# Patient Record
Sex: Female | Born: 1970 | Race: White | Hispanic: No | Marital: Married | Smoking: Never smoker
Health system: Southern US, Community
[De-identification: ages and names within clinical notes are randomized; demographics above are authoritative.]

## PROBLEM LIST (undated history)

## (undated) DIAGNOSIS — D649 Anemia, unspecified: Secondary | ICD-10-CM

## (undated) DIAGNOSIS — A630 Anogenital (venereal) warts: Secondary | ICD-10-CM

## (undated) DIAGNOSIS — G43909 Migraine, unspecified, not intractable, without status migrainosus: Secondary | ICD-10-CM

## (undated) DIAGNOSIS — A6000 Herpesviral infection of urogenital system, unspecified: Secondary | ICD-10-CM

## (undated) DIAGNOSIS — M3500 Sicca syndrome, unspecified: Secondary | ICD-10-CM

## (undated) DIAGNOSIS — K5792 Diverticulitis of intestine, part unspecified, without perforation or abscess without bleeding: Secondary | ICD-10-CM

## (undated) DIAGNOSIS — I7300 Raynaud's syndrome without gangrene: Secondary | ICD-10-CM

## (undated) DIAGNOSIS — K219 Gastro-esophageal reflux disease without esophagitis: Secondary | ICD-10-CM

## (undated) HISTORY — DX: Anemia, unspecified: D64.9

## (undated) HISTORY — DX: Anogenital (venereal) warts: A63.0

## (undated) HISTORY — DX: Herpesviral infection of urogenital system, unspecified: A60.00

## (undated) HISTORY — PX: BILATERAL SALPINGECTOMY: SHX5743

## (undated) HISTORY — DX: Gastro-esophageal reflux disease without esophagitis: K21.9

## (undated) HISTORY — DX: Migraine, unspecified, not intractable, without status migrainosus: G43.909

---

## 2000-01-11 ENCOUNTER — Emergency Department (HOSPITAL_COMMUNITY): Admission: EM | Admit: 2000-01-11 | Discharge: 2000-01-11 | Payer: Self-pay | Admitting: *Deleted

## 2004-12-13 ENCOUNTER — Encounter: Admission: RE | Admit: 2004-12-13 | Discharge: 2004-12-13 | Payer: Self-pay | Admitting: Family Medicine

## 2011-07-21 ENCOUNTER — Other Ambulatory Visit: Payer: Self-pay | Admitting: Family Medicine

## 2011-07-21 DIAGNOSIS — N6310 Unspecified lump in the right breast, unspecified quadrant: Secondary | ICD-10-CM

## 2011-08-01 ENCOUNTER — Ambulatory Visit
Admission: RE | Admit: 2011-08-01 | Discharge: 2011-08-01 | Disposition: A | Discharge status: Home / Self Care | Payer: BC Managed Care – PPO | Source: Ambulatory Visit | Attending: Family Medicine | Admitting: Family Medicine

## 2011-08-01 DIAGNOSIS — N6310 Unspecified lump in the right breast, unspecified quadrant: Secondary | ICD-10-CM

## 2013-01-06 ENCOUNTER — Encounter (INDEPENDENT_AMBULATORY_CARE_PROVIDER_SITE_OTHER): Payer: Self-pay

## 2013-01-06 ENCOUNTER — Encounter: Payer: Self-pay | Admitting: Family Medicine

## 2013-01-06 ENCOUNTER — Ambulatory Visit (INDEPENDENT_AMBULATORY_CARE_PROVIDER_SITE_OTHER): Payer: BC Managed Care – PPO | Admitting: Family Medicine

## 2013-01-06 VITALS — BP 127/81 | HR 94 | Resp 16 | Ht 67.0 in | Wt 186.0 lb

## 2013-01-06 DIAGNOSIS — N760 Acute vaginitis: Secondary | ICD-10-CM | POA: Insufficient documentation

## 2013-01-06 DIAGNOSIS — W5501XA Bitten by cat, initial encounter: Secondary | ICD-10-CM | POA: Insufficient documentation

## 2013-01-06 DIAGNOSIS — IMO0001 Reserved for inherently not codable concepts without codable children: Secondary | ICD-10-CM

## 2013-01-06 DIAGNOSIS — T148XXA Other injury of unspecified body region, initial encounter: Secondary | ICD-10-CM

## 2013-01-06 DIAGNOSIS — Z23 Encounter for immunization: Secondary | ICD-10-CM | POA: Insufficient documentation

## 2013-01-06 MED ORDER — METRONIDAZOLE 0.75 % VA GEL: 1.0000 | Freq: Two times a day (BID) | VAGINAL | Status: AC

## 2013-01-06 MED ORDER — CEPHALEXIN 500 MG PO CAPS: 500.0000 mg | ORAL_CAPSULE | Freq: Four times a day (QID) | ORAL | Status: AC

## 2013-01-06 MED ORDER — TERCONAZOLE 0.8 % VA CREA: 1.0000 | TOPICAL_CREAM | Freq: Every day | VAGINAL | Status: AC

## 2013-01-06 MED ORDER — MUPIROCIN CALCIUM 2 % EX CREA: TOPICAL_CREAM | CUTANEOUS | Status: AC

## 2013-01-06 MED ORDER — FLUCONAZOLE 200 MG PO TABS: ORAL_TABLET | ORAL | Status: AC

## 2013-01-06 NOTE — Assessment & Plan Note (Signed)
Treating with both Keflex and Bactroban to make sure that she does not develop a serious infection.

## 2013-01-06 NOTE — Assessment & Plan Note (Signed)
She received a Tdap without difficulty.

## 2013-01-06 NOTE — Assessment & Plan Note (Signed)
She has a long history of vaginal infections especially after taking an antibiotic. She was given medications for both BV and Yeast.

## 2013-01-06 NOTE — Patient Instructions (Signed)

## 2013-01-06 NOTE — Progress Notes (Signed)
  Subjective:    Patient ID: Amanda Pugh, female    DOB: 07/17/1970, 42 y.o.   MRN: [id number]   Amanda Pugh is here today to have her left index finger evaluated. She was bitten/scratched by a cat at work.      Animal Bite  The incident occurred more than 2 days ago. The incident occurred at work. There is an injury to the left index finger. The pain is moderate. It is unlikely that a foreign body is present. There have been no prior injuries to these areas. She is right-handed. Her tetanus status is out of date. She has been behaving normally. There were no sick contacts. She has received no recent medical care.      Review of Systems  Constitutional: Negative.   HENT: Negative.   Eyes: Negative.   Respiratory: Negative.   Gastrointestinal: Negative.   Endocrine: Negative.   Genitourinary: Negative.   Musculoskeletal:       Left index finger is puffy and red.  Skin: Positive for wound.  Allergic/Immunologic: Negative.   Neurological: Negative.   Hematological: Negative.   Psychiatric/Behavioral: Negative.  Negative for behavioral problems.     Past Medical History  Diagnosis Date  . Anemia   . GERD (gastroesophageal reflux disease)   . Migraines   . Genital warts   . Genital herpes       Family History  Problem Relation Age of Onset  . Hyperlipidemia Mother   . Hypertension Father   . Cancer Paternal Aunt     Breast Cancer  . Diabetes Maternal Grandmother       History   Social History Narrative   Marital Status: Married Museum/gallery curator)   Children:  None    Pets: Dog, Cat    Living Situation: Husband   Occupation: Designer, industrial/product   Alcohol Use:  Occasional   Drug Use:  None   Diet:  Regular   Exercise:  None   Hobbies: Shopping   [Tobacco: Never smoker]              Objective:   Physical Exam  Constitutional: She is oriented to person, place, and time. She appears well-developed and well-nourished.  HENT:   Head: Normocephalic.  Eyes: Pupils are equal, round, and reactive to light.  Neck: Normal range of motion.  Pulmonary/Chest: Effort normal.  Musculoskeletal: Normal range of motion.  Neurological: She is alert and oriented to person, place, and time.  Skin: Skin is warm and dry. There is erythema.  Puncture wound on left index finger   Psychiatric: She has a normal mood and affect. Her behavior is normal. Judgment and thought content normal.          Assessment & Plan:

## 2015-07-27 ENCOUNTER — Emergency Department (HOSPITAL_BASED_OUTPATIENT_CLINIC_OR_DEPARTMENT_OTHER)
Admission: EM | Admit: 2015-07-27 | Discharge: 2015-07-27 | Disposition: A | Discharge status: Home / Self Care | Payer: Commercial Managed Care - PPO | Attending: Emergency Medicine | Admitting: Emergency Medicine

## 2015-07-27 ENCOUNTER — Emergency Department (HOSPITAL_BASED_OUTPATIENT_CLINIC_OR_DEPARTMENT_OTHER): Payer: Commercial Managed Care - PPO

## 2015-07-27 ENCOUNTER — Encounter (HOSPITAL_BASED_OUTPATIENT_CLINIC_OR_DEPARTMENT_OTHER): Payer: Self-pay | Admitting: *Deleted

## 2015-07-27 DIAGNOSIS — R1011 Right upper quadrant pain: Secondary | ICD-10-CM | POA: Diagnosis not present

## 2015-07-27 DIAGNOSIS — Z79899 Other long term (current) drug therapy: Secondary | ICD-10-CM | POA: Diagnosis not present

## 2015-07-27 LAB — URINALYSIS, ROUTINE W REFLEX MICROSCOPIC
BILIRUBIN URINE: NEGATIVE
GLUCOSE, UA: NEGATIVE mg/dL
HGB URINE DIPSTICK: NEGATIVE
KETONES UR: NEGATIVE mg/dL
Nitrite: NEGATIVE
PROTEIN: NEGATIVE mg/dL
SPECIFIC GRAVITY, URINE: 1.024 (ref 1.005–1.030)
pH: 5.5 (ref 5.0–8.0)

## 2015-07-27 LAB — CBC WITH DIFFERENTIAL/PLATELET
Basophils Absolute: 0 10*3/uL (ref 0.0–0.1)
Basophils Relative: 0 %
EOS ABS: 0.1 10*3/uL (ref 0.0–0.7)
Eosinophils Relative: 1 %
HCT: 38.7 % (ref 36.0–46.0)
HEMOGLOBIN: 13 g/dL (ref 12.0–15.0)
LYMPHS ABS: 2.7 10*3/uL (ref 0.7–4.0)
LYMPHS PCT: 32 %
MCH: 30.3 pg (ref 26.0–34.0)
MCHC: 33.6 g/dL (ref 30.0–36.0)
MCV: 90.2 fL (ref 78.0–100.0)
Monocytes Absolute: 0.5 10*3/uL (ref 0.1–1.0)
Monocytes Relative: 6 %
NEUTROS ABS: 5.1 10*3/uL (ref 1.7–7.7)
NEUTROS PCT: 61 %
Platelets: 284 10*3/uL (ref 150–400)
RBC: 4.29 MIL/uL (ref 3.87–5.11)
RDW: 12.6 % (ref 11.5–15.5)
WBC: 8.3 10*3/uL (ref 4.0–10.5)

## 2015-07-27 LAB — COMPREHENSIVE METABOLIC PANEL
ALT: 16 U/L (ref 14–54)
ANION GAP: 8 (ref 5–15)
AST: 19 U/L (ref 15–41)
Albumin: 4 g/dL (ref 3.5–5.0)
Alkaline Phosphatase: 55 U/L (ref 38–126)
BUN: 10 mg/dL (ref 6–20)
CHLORIDE: 103 mmol/L (ref 101–111)
CO2: 24 mmol/L (ref 22–32)
CREATININE: 0.72 mg/dL (ref 0.44–1.00)
Calcium: 8.7 mg/dL — ABNORMAL LOW (ref 8.9–10.3)
Glucose, Bld: 118 mg/dL — ABNORMAL HIGH (ref 65–99)
POTASSIUM: 3.8 mmol/L (ref 3.5–5.1)
Sodium: 135 mmol/L (ref 135–145)
Total Bilirubin: 0.8 mg/dL (ref 0.3–1.2)
Total Protein: 7.6 g/dL (ref 6.5–8.1)

## 2015-07-27 LAB — URINE MICROSCOPIC-ADD ON: RBC / HPF: NONE SEEN RBC/hpf (ref 0–5)

## 2015-07-27 LAB — PREGNANCY, URINE: PREG TEST UR: NEGATIVE

## 2015-07-27 NOTE — Discharge Instructions (Signed)
There does not appear to be an emergent cause for your symptoms at this time. Your exam, labs and ultrasound all reassuring. Please follow-up with your doctor in the next couple of days for reevaluation. Return to ED for new or worsening symptoms.

## 2015-07-27 NOTE — ED Notes (Signed)
Pt in ultrasound at present

## 2015-07-27 NOTE — ED Notes (Signed)
Right upper quadrant pain this am while driving.

## 2015-07-27 NOTE — ED Provider Notes (Signed)
CSN: [id number]     Arrival date & time 07/27/15  1218 History   First MD Initiated Contact with Patient 07/27/15 1220     Chief Complaint  Patient presents with  . Abdominal Pain     (Consider location/radiation/quality/duration/timing/severity/associated sxs/prior Treatment) HPI Amanda Pugh is a 45 y.o. female here for evaluation of right upper quadrant abdominal pain. Patient reports she was on her way to work when she experienced a sudden onset sharp, burning pain in her right upper quadrant that lasted approximately 20 minutes and resolved spontaneously. She reports the pain returned again after eating breakfast and lasted for approximately 5 minutes. She reports having this pain before in the past and noticed that it was related to greasy and fatty foods that she was eating. She does report having "a lot of hot wings over the weekend". She reports associated nausea, but no vomiting. Denies any fevers, chills, other abdominal pain, urinary symptoms, diarrhea or constipation, hematochezia, melena. No chest pain, short of breath, cough, unusual rash.  Past Medical History  Diagnosis Date  . Anemia   . GERD (gastroesophageal reflux disease)   . Migraines   . Genital warts   . Genital herpes    History reviewed. No pertinent past surgical history. Family History  Problem Relation Age of Onset  . Hyperlipidemia Mother   . Hypertension Father   . Cancer Paternal Aunt     Breast Cancer  . Diabetes Maternal Grandmother    Social History  Substance Use Topics  . Smoking status: Never Smoker   . Smokeless tobacco: Never Used  . Alcohol Use: 0.5 oz/week    1 drink(s) per week   OB History    No data available     Review of Systems A 10 point review of systems was completed and was negative except for pertinent positives and negatives as mentioned in the history of present illness     Allergies  Minocycline  Home Medications   Prior to Admission medications    Medication Sig Start Date End Date Taking? Authorizing Provider  AmLODIPine Besylate (NORVASC PO) Take by mouth.   Yes Historical Provider, MD  cyclobenzaprine (FLEXERIL) 10 MG tablet Take 10 mg by mouth 3 (three) times daily as needed for muscle spasms.    Historical Provider, MD   BP 128/72 mmHg  Pulse 82  Temp(Src) 99.1 F (37.3 C) (Oral)  Resp 16  Ht 5\' 6"  (1.676 m)  Wt 87.998 kg  BMI 31.33 kg/m2  SpO2 100%  LMP 07/06/2015 Physical Exam  Constitutional: She is oriented to person, place, and time. She appears well-developed and well-nourished.  HENT:  Head: Normocephalic and atraumatic.  Mouth/Throat: Oropharynx is clear and moist.  Eyes: Conjunctivae are normal. Pupils are equal, round, and reactive to light. Right eye exhibits no discharge. Left eye exhibits no discharge. No scleral icterus.  Neck: Neck supple.  Cardiovascular: Normal rate, regular rhythm and normal heart sounds.   Pulmonary/Chest: Effort normal and breath sounds normal. No respiratory distress. She has no wheezes. She has no rales.  Abdominal: Soft.  Mild/moderate tenderness to palpation in right abdomen. Negative Murphy's. Abdomen otherwise soft, nondistended without rebound or guarding. No other rashes or deformities noted.  Musculoskeletal: She exhibits no tenderness.  Neurological: She is alert and oriented to person, place, and time.  Cranial Nerves II-XII grossly intact  Skin: Skin is warm and dry. No rash noted.  Psychiatric: She has a normal mood and affect.  Nursing note and vitals  reviewed.   ED Course  Procedures (including critical care time) Labs Review Labs Reviewed  URINALYSIS, ROUTINE W REFLEX MICROSCOPIC (NOT AT [facility]) - Abnormal; Notable for the following:    Leukocytes, UA TRACE (*)    All other components within normal limits  COMPREHENSIVE METABOLIC PANEL - Abnormal; Notable for the following:    Glucose, Bld 118 (*)    Calcium 8.7 (*)    All other components within normal limits   URINE MICROSCOPIC-ADD ON - Abnormal; Notable for the following:    Squamous Epithelial / LPF 0-5 (*)    Bacteria, UA MANY (*)    All other components within normal limits  CBC WITH DIFFERENTIAL/PLATELET  PREGNANCY, URINE    Imaging Review US Abdomen Complete  07/27/2015  CLINICAL DATA:  45 year old presenting with acute onset of right upper quadrant abdominal pain and nausea which began earlier this morning. EXAM: ABDOMEN ULTRASOUND COMPLETE COMPARISON:  None. FINDINGS: Gallbladder: No shadowing gallstones or echogenic sludge. No gallbladder wall thickening or pericholecystic fluid. Negative sonographic Murphy sign according to the ultrasound technologist. Common bile duct: Diameter: Approximately 2 mm. Distal common bile duct obscured by duodenal bowel gas. Liver: Diffusely increased and coarsened echotexture without focal hepatic parenchymal abnormality. Patent portal vein with hepatopetal flow. IVC: Patent. Pancreas: While difficult to visualize in its entirety, visualized portions normal in appearance. Spleen: Normal size and echotexture without focal parenchymal abnormality. Right Kidney: Length: Approximately 10.2 cm. 7 mm hyperechoic cortical mass involving the mid kidney. Well-preserved cortex. No hydronephrosis. No shadowing calculi. Left Kidney: Length: Approximately 10.3 cm. Well-preserved cortex. No hydronephrosis. No focal parenchymal abnormality. Shadowing calculi arising from lower pole calices, the largest approximating 7 mm. Abdominal aorta: Normal in caliber throughout its visualized course in the abdomen without evidence of significant atherosclerosis. Maximum diameter 2.1 cm cm. Other findings: None. IMPRESSION: 1. Diffuse hepatic steatosis and/or hepatocellular disease without focal hepatic parenchymal abnormality. 2. Nonobstructing calculi involving lower pole calices of the left kidney. 3. 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 Electronically Signed   By: Hulan Saas M.D.   On: 07/27/2015 13:52   I have personally reviewed and evaluated these images and lab results as part of my medical decision-making.   EKG Interpretation None      MDM  Amanda Pugh is a 45 y.o. female presents for evaluation of right upper quadrant pain. Clinical presentation consistent with biliary colic. Plan to obtain screening labs, abdominal ultrasound. Will reevaluate. Upon reevaluation, patient states her symptoms have resolved. Her labs are unremarkable, LFTs not concerning. Pregnancy negative. Abdominal ultrasound overall unremarkable for any emergent pathology. Discussed incidental angiomyolipoma in right kidney. Also discussed follow-up with PCP in the next couple of days for reevaluation. Discussed return precautions. Verbalizes understanding and agrees with assessment discharge. Overall, hemodynamically stable, afebrile, appears well and nontoxic and appropriate for discharge. Final diagnoses:  Right upper quadrant pain       Joycie Peek, PA-C 07/27/15 1534  Lyndal Pulley, MD 07/27/15 2146

## 2015-09-14 ENCOUNTER — Emergency Department (HOSPITAL_BASED_OUTPATIENT_CLINIC_OR_DEPARTMENT_OTHER): Payer: Commercial Managed Care - PPO

## 2015-09-14 ENCOUNTER — Encounter (HOSPITAL_BASED_OUTPATIENT_CLINIC_OR_DEPARTMENT_OTHER): Payer: Self-pay | Admitting: *Deleted

## 2015-09-14 ENCOUNTER — Inpatient Hospital Stay (HOSPITAL_BASED_OUTPATIENT_CLINIC_OR_DEPARTMENT_OTHER)
Admission: EM | Admit: 2015-09-14 | Discharge: 2015-09-17 | DRG: 392 | Disposition: A | Discharge status: Home / Self Care | Payer: Commercial Managed Care - PPO | Attending: Internal Medicine | Admitting: Internal Medicine

## 2015-09-14 DIAGNOSIS — I7300 Raynaud's syndrome without gangrene: Secondary | ICD-10-CM | POA: Diagnosis present

## 2015-09-14 DIAGNOSIS — Z833 Family history of diabetes mellitus: Secondary | ICD-10-CM

## 2015-09-14 DIAGNOSIS — K219 Gastro-esophageal reflux disease without esophagitis: Secondary | ICD-10-CM | POA: Diagnosis present

## 2015-09-14 DIAGNOSIS — K1120 Sialoadenitis, unspecified: Secondary | ICD-10-CM | POA: Diagnosis present

## 2015-09-14 DIAGNOSIS — E871 Hypo-osmolality and hyponatremia: Secondary | ICD-10-CM | POA: Diagnosis present

## 2015-09-14 DIAGNOSIS — Z8249 Family history of ischemic heart disease and other diseases of the circulatory system: Secondary | ICD-10-CM

## 2015-09-14 DIAGNOSIS — D72829 Elevated white blood cell count, unspecified: Secondary | ICD-10-CM | POA: Diagnosis present

## 2015-09-14 DIAGNOSIS — D6489 Other specified anemias: Secondary | ICD-10-CM | POA: Diagnosis present

## 2015-09-14 DIAGNOSIS — Z888 Allergy status to other drugs, medicaments and biological substances status: Secondary | ICD-10-CM

## 2015-09-14 DIAGNOSIS — M3500 Sicca syndrome, unspecified: Secondary | ICD-10-CM | POA: Diagnosis present

## 2015-09-14 DIAGNOSIS — E876 Hypokalemia: Secondary | ICD-10-CM | POA: Diagnosis present

## 2015-09-14 DIAGNOSIS — Z6831 Body mass index (BMI) 31.0-31.9, adult: Secondary | ICD-10-CM | POA: Diagnosis not present

## 2015-09-14 DIAGNOSIS — K5720 Diverticulitis of large intestine with perforation and abscess without bleeding: Principal | ICD-10-CM | POA: Diagnosis present

## 2015-09-14 DIAGNOSIS — R1030 Lower abdominal pain, unspecified: Secondary | ICD-10-CM | POA: Diagnosis not present

## 2015-09-14 DIAGNOSIS — Z79899 Other long term (current) drug therapy: Secondary | ICD-10-CM | POA: Diagnosis not present

## 2015-09-14 DIAGNOSIS — R188 Other ascites: Secondary | ICD-10-CM | POA: Diagnosis present

## 2015-09-14 DIAGNOSIS — K5909 Other constipation: Secondary | ICD-10-CM | POA: Diagnosis present

## 2015-09-14 DIAGNOSIS — K5792 Diverticulitis of intestine, part unspecified, without perforation or abscess without bleeding: Secondary | ICD-10-CM | POA: Diagnosis present

## 2015-09-14 DIAGNOSIS — E668 Other obesity: Secondary | ICD-10-CM | POA: Diagnosis present

## 2015-09-14 HISTORY — DX: Sjogren syndrome, unspecified: M35.00

## 2015-09-14 HISTORY — DX: Raynaud's syndrome without gangrene: I73.00

## 2015-09-14 LAB — CBC WITH DIFFERENTIAL/PLATELET
Basophils Absolute: 0 10*3/uL (ref 0.0–0.1)
Basophils Relative: 0 %
EOS ABS: 0 10*3/uL (ref 0.0–0.7)
Eosinophils Relative: 0 %
HEMATOCRIT: 39.1 % (ref 36.0–46.0)
HEMOGLOBIN: 13.3 g/dL (ref 12.0–15.0)
LYMPHS ABS: 2 10*3/uL (ref 0.7–4.0)
Lymphocytes Relative: 14 %
MCH: 30.8 pg (ref 26.0–34.0)
MCHC: 34 g/dL (ref 30.0–36.0)
MCV: 90.5 fL (ref 78.0–100.0)
MONOS PCT: 7 %
Monocytes Absolute: 1 10*3/uL (ref 0.1–1.0)
NEUTROS ABS: 10.7 10*3/uL — AB (ref 1.7–7.7)
NEUTROS PCT: 79 %
Platelets: 237 10*3/uL (ref 150–400)
RBC: 4.32 MIL/uL (ref 3.87–5.11)
RDW: 12.4 % (ref 11.5–15.5)
WBC: 13.6 10*3/uL — AB (ref 4.0–10.5)

## 2015-09-14 LAB — COMPREHENSIVE METABOLIC PANEL
ALBUMIN: 4 g/dL (ref 3.5–5.0)
ALK PHOS: 45 U/L (ref 38–126)
ALT: 19 U/L (ref 14–54)
AST: 19 U/L (ref 15–41)
Anion gap: 8 (ref 5–15)
BILIRUBIN TOTAL: 0.9 mg/dL (ref 0.3–1.2)
BUN: 7 mg/dL (ref 6–20)
CALCIUM: 8.9 mg/dL (ref 8.9–10.3)
CO2: 25 mmol/L (ref 22–32)
CREATININE: 0.82 mg/dL (ref 0.44–1.00)
Chloride: 101 mmol/L (ref 101–111)
GFR calc Af Amer: >60 mL/min (ref >60)
GFR calc non Af Amer: >60 mL/min (ref >60)
GLUCOSE: 130 mg/dL — AB (ref 65–99)
Potassium: 4 mmol/L (ref 3.5–5.1)
SODIUM: 134 mmol/L — AB (ref 135–145)
TOTAL PROTEIN: 7.5 g/dL (ref 6.5–8.1)

## 2015-09-14 LAB — URINALYSIS, ROUTINE W REFLEX MICROSCOPIC
BILIRUBIN URINE: NEGATIVE
GLUCOSE, UA: NEGATIVE mg/dL
HGB URINE DIPSTICK: NEGATIVE
KETONES UR: NEGATIVE mg/dL
Leukocytes, UA: NEGATIVE
Nitrite: NEGATIVE
PH: 7 (ref 5.0–8.0)
Protein, ur: NEGATIVE mg/dL
SPECIFIC GRAVITY, URINE: 1.005 (ref 1.005–1.030)

## 2015-09-14 LAB — LIPASE, BLOOD: Lipase: 18 U/L (ref 11–51)

## 2015-09-14 LAB — PREGNANCY, URINE: Preg Test, Ur: NEGATIVE

## 2015-09-14 LAB — LACTIC ACID, PLASMA
LACTIC ACID, VENOUS: 0.7 mmol/L (ref 0.5–1.9)
Lactic Acid, Venous: 1.3 mmol/L (ref 0.5–1.9)

## 2015-09-14 MED ORDER — ENOXAPARIN SODIUM 40 MG/0.4ML ~~LOC~~ SOLN
40.0000 mg | SUBCUTANEOUS | Status: DC
  Administered 2015-09-14 – 2015-09-16 (×3): 40 mg via SUBCUTANEOUS
  Filled 2015-09-14 (×3): qty 0.4

## 2015-09-14 MED ORDER — PIPERACILLIN-TAZOBACTAM 3.375 G IVPB
3.3750 g | Freq: Three times a day (TID) | INTRAVENOUS | Status: DC
  Administered 2015-09-14 – 2015-09-16 (×7): 3.375 g via INTRAVENOUS
  Filled 2015-09-14 (×9): qty 50

## 2015-09-14 MED ORDER — ONDANSETRON HCL 4 MG/2ML IJ SOLN: 4.0000 mg | Freq: Four times a day (QID) | INTRAMUSCULAR | Status: DC | PRN

## 2015-09-14 MED ORDER — ACETAMINOPHEN 325 MG PO TABS: 650.0000 mg | ORAL_TABLET | Freq: Four times a day (QID) | ORAL | Status: DC | PRN

## 2015-09-14 MED ORDER — ONDANSETRON HCL 4 MG/2ML IJ SOLN
4.0000 mg | Freq: Once | INTRAMUSCULAR | Status: AC
  Administered 2015-09-14: 4 mg via INTRAVENOUS
  Filled 2015-09-14: qty 2

## 2015-09-14 MED ORDER — DEXTROSE-NACL 5-0.9 % IV SOLN
INTRAVENOUS | Status: DC
  Administered 2015-09-14: 18:00:00 via INTRAVENOUS
  Administered 2015-09-15: 100 mL/h via INTRAVENOUS

## 2015-09-14 MED ORDER — IOPAMIDOL (ISOVUE-300) INJECTION 61%
100.0000 mL | Freq: Once | INTRAVENOUS | Status: AC | PRN
  Administered 2015-09-14: 100 mL via INTRAVENOUS

## 2015-09-14 MED ORDER — SODIUM CHLORIDE 0.9 % IV BOLUS (SEPSIS)
1000.0000 mL | Freq: Once | INTRAVENOUS | Status: AC
  Administered 2015-09-14: 1000 mL via INTRAVENOUS

## 2015-09-14 MED ORDER — MORPHINE SULFATE (PF) 2 MG/ML IV SOLN
1.0000 mg | INTRAVENOUS | Status: DC | PRN
  Administered 2015-09-14: 1 mg via INTRAVENOUS
  Filled 2015-09-14: qty 1

## 2015-09-14 MED ORDER — MORPHINE SULFATE (PF) 4 MG/ML IV SOLN
4.0000 mg | Freq: Once | INTRAVENOUS | Status: AC
  Administered 2015-09-14: 4 mg via INTRAVENOUS
  Filled 2015-09-14: qty 1

## 2015-09-14 MED ORDER — CETYLPYRIDINIUM CHLORIDE 0.05 % MT LIQD: 7.0000 mL | Freq: Two times a day (BID) | OROMUCOSAL | Status: DC

## 2015-09-14 MED ORDER — CHLORHEXIDINE GLUCONATE 0.12 % MT SOLN
15.0000 mL | Freq: Two times a day (BID) | OROMUCOSAL | Status: DC
  Administered 2015-09-14 – 2015-09-15 (×2): 15 mL via OROMUCOSAL
  Filled 2015-09-14 (×3): qty 15

## 2015-09-14 MED ORDER — DEXTROSE-NACL 5-0.45 % IV SOLN
INTRAVENOUS | Status: AC
  Administered 2015-09-14: 14:00:00 via INTRAVENOUS

## 2015-09-14 MED ORDER — ACETAMINOPHEN 650 MG RE SUPP: 650.0000 mg | Freq: Four times a day (QID) | RECTAL | Status: DC | PRN

## 2015-09-14 MED ORDER — PIPERACILLIN-TAZOBACTAM 3.375 G IVPB 30 MIN
3.3750 g | Freq: Once | INTRAVENOUS | Status: AC
  Administered 2015-09-14: 3.375 g via INTRAVENOUS
  Filled 2015-09-14 (×2): qty 50

## 2015-09-14 MED ORDER — ONDANSETRON HCL 4 MG PO TABS: 4.0000 mg | ORAL_TABLET | Freq: Four times a day (QID) | ORAL | Status: DC | PRN

## 2015-09-14 MED ORDER — HYDROCODONE-ACETAMINOPHEN 5-325 MG PO TABS
1.0000 | ORAL_TABLET | ORAL | Status: DC | PRN
  Administered 2015-09-15 (×2): 1 via ORAL
  Filled 2015-09-14: qty 2
  Filled 2015-09-14: qty 1

## 2015-09-14 NOTE — ED Notes (Signed)
MD at bedside. 

## 2015-09-14 NOTE — Progress Notes (Signed)
Pharmacy Antibiotic Note  Amanda Pugh is a 45 y.o. female admitted on 09/14/2015 with diverticulitis/small abscess.  Pharmacy has been consulted for piperacillin/tazobactam dosing. 1st dose was given in ED  Plan:  Piperacillin/tazobactam 3.375gm IV q8h over 4h infusion   Based on renal function, unlikely patient will require dose adjust thus pharmacy will sign-off at this time  Height: 5\' 6"  (167.6 cm) Weight: 190 lb (86.183 kg) IBW/kg (Calculated) : 59.3  Temp (24hrs), Avg:98.6 F (37 C), Min:98.4 F (36.9 C), Max:98.7 F (37.1 C)   Recent Labs Lab 09/14/15 0925  WBC 13.6*  CREATININE 0.82    Estimated Creatinine Clearance: 97.9 mL/min (by C-G formula based on Cr of 0.82).    Allergies  Allergen Reactions  . Minocycline Shortness Of Breath    Antimicrobials this admission: 7/11 >> pip/tazo >>  Dose adjustments this admission:  Microbiology results: 7/11 BCx:  7/11 UCx:    Thank you for allowing pharmacy to be a part of this patient's care.  Juliette Alcideustin Annais Crafts, PharmD, BCPS.   Pager: [phone] 09/14/2015 3:50 PM

## 2015-09-14 NOTE — Consult Note (Signed)
Reason for Consult: Diverticulitis  Referring Physician: Dr. Barbaraann Cao is an 45 y.o. female.  HPI: This is a 45 year old Caucasian female with history of Sjogren's syndrome, Raynaud disease on plaque we will.  Mild obesity.  Had a colonoscopy 10 years ago which showed diverticulosis.  She is admitted today with her first ever episode of acute diverticulitis.  She reports a 2 to three-day history of lower abdominal pain.  No vomiting.  She is chronically constipated.  She has had 3 solid bowel movements today.  No blood.  No diarrhea.  She came to [facility] because the pain just would not go away.  WBC 13,600.  CT scan showed noticeable sigmoid diverticulitis with thickening and edema and a tiny pericolonic abscess.  A little bit of ascites in the pelvis but no free air or formed abscess.  No obstruction.  She was admitted by the [facility] hospitalist.  Past Medical History  Diagnosis Date  . Anemia   . GERD (gastroesophageal reflux disease)   . Migraines   . Genital warts   . Genital herpes   . Sjoegren syndrome ([place])   . Raynaud's disease     Past Surgical History  Procedure Laterality Date  . Bilateral salpingectomy      Family History  Problem Relation Age of Onset  . Hyperlipidemia Mother   . Hypertension Father   . Cancer Paternal Aunt     Breast Cancer  . Diabetes Maternal Grandmother     Social History:  reports that she has never smoked. She has never used smokeless tobacco. She reports that she does not drink alcohol or use illicit drugs.  Allergies:  Allergies  Allergen Reactions  . Minocycline Shortness Of Breath     Medications: Plaque we'll 200 mg 2 times daily, Flagyl 500 mg 2 times daily, Norvasc daily.  Flexeril 10 mg 3 times daily as needed for muscle spasms.  Results for orders placed or performed during the hospital encounter of 09/14/15 (from the past 48 hour(s))  Urinalysis, Routine w reflex microscopic (not at [facility])      Status: None   Collection Time: 09/14/15  8:56 AM  Result Value Ref Range   Color, Urine YELLOW YELLOW   APPearance CLEAR CLEAR   Specific Gravity, Urine 1.005 1.005 - 1.030   pH 7.0 5.0 - 8.0   Glucose, UA NEGATIVE NEGATIVE mg/dL   Hgb urine dipstick NEGATIVE NEGATIVE   Bilirubin Urine NEGATIVE NEGATIVE   Ketones, ur NEGATIVE NEGATIVE mg/dL   Protein, ur NEGATIVE NEGATIVE mg/dL   Nitrite NEGATIVE NEGATIVE   Leukocytes, UA NEGATIVE NEGATIVE    Comment: MICROSCOPIC NOT DONE ON URINES WITH NEGATIVE PROTEIN, BLOOD, LEUKOCYTES, NITRITE, OR GLUCOSE <1000 mg/dL.  Pregnancy, urine     Status: None   Collection Time: 09/14/15  8:56 AM  Result Value Ref Range   Preg Test, Ur NEGATIVE NEGATIVE    Comment:        THE SENSITIVITY OF THIS METHODOLOGY IS >20 mIU/mL.   CBC with Differential     Status: Abnormal   Collection Time: 09/14/15  9:25 AM  Result Value Ref Range   WBC 13.6 (H) 4.0 - 10.5 K/uL   RBC 4.32 3.87 - 5.11 MIL/uL   Hemoglobin 13.3 12.0 - 15.0 g/dL   HCT 39.1 36.0 - 46.0 %   MCV 90.5 78.0 - 100.0 fL   MCH 30.8 26.0 - 34.0 pg   MCHC 34.0 30.0 - 36.0 g/dL  RDW 12.4 11.5 - 15.5 %   Platelets 237 150 - 400 K/uL   Neutrophils Relative % 79 %   Neutro Abs 10.7 (H) 1.7 - 7.7 K/uL   Lymphocytes Relative 14 %   Lymphs Abs 2.0 0.7 - 4.0 K/uL   Monocytes Relative 7 %   Monocytes Absolute 1.0 0.1 - 1.0 K/uL   Eosinophils Relative 0 %   Eosinophils Absolute 0.0 0.0 - 0.7 K/uL   Basophils Relative 0 %   Basophils Absolute 0.0 0.0 - 0.1 K/uL  Comprehensive metabolic panel     Status: Abnormal   Collection Time: 09/14/15  9:25 AM  Result Value Ref Range   Sodium 134 (L) 135 - 145 mmol/L   Potassium 4.0 3.5 - 5.1 mmol/L   Chloride 101 101 - 111 mmol/L   CO2 25 22 - 32 mmol/L   Glucose, Bld 130 (H) 65 - 99 mg/dL   BUN 7 6 - 20 mg/dL   Creatinine, Ser 0.82 0.44 - 1.00 mg/dL   Calcium 8.9 8.9 - 10.3 mg/dL   Total Protein 7.5 6.5 - 8.1 g/dL   Albumin 4.0 3.5 - 5.0 g/dL   AST  19 15 - 41 U/L   ALT 19 14 - 54 U/L   Alkaline Phosphatase 45 38 - 126 U/L   Total Bilirubin 0.9 0.3 - 1.2 mg/dL   GFR calc non Af Amer >60 >60 mL/min   GFR calc Af Amer >60 >60 mL/min    Comment: (NOTE) The eGFR has been calculated using the CKD EPI equation. This calculation has not been validated in all clinical situations. eGFR's persistently <60 mL/min signify possible Chronic Kidney Disease.    Anion gap 8 5 - 15  Lipase, blood     Status: None   Collection Time: 09/14/15  9:25 AM  Result Value Ref Range   Lipase 18 11 - 51 U/L  Blood culture (routine x 2)     Status: None (Preliminary result)   Collection Time: 09/14/15 12:25 PM  Result Value Ref Range   Specimen Description      BLOOD LEFT ANTECUBITAL Performed at [facility]    Culture PENDING    Report Status PENDING     Ct Abdomen Pelvis W Contrast  09/14/2015  CLINICAL DATA:  Left lower quadrant pain for 3 days. EXAM: CT ABDOMEN AND PELVIS WITH CONTRAST TECHNIQUE: Multidetector CT imaging of the abdomen and pelvis was performed using the standard protocol following bolus administration of intravenous contrast. CONTRAST:  100 ml ISOVUE-300 IOPAMIDOL (ISOVUE-300) INJECTION 61% COMPARISON:  None. FINDINGS: The lung bases are clear.  No pleural or pericardial effusion. The liver, gallbladder, adrenal glands, spleen, pancreas and right kidney appear normal. A nonobstructing 0.7 cm stone is seen in the lower pole of the left kidney. The patient has diverticulosis. Marked wall thickening and extensive inflammatory change are seen about the mid sigmoid colon consistent with acute diverticulitis. There is free fluid in the pelvis. A rim enhancing fluid collection measuring 0.7 cm in diameter compatible with a tiny abscess is seen in the left pelvis. No free intraperitoneal air is identified. The stomach and small bowel appear normal. The uterus and right ovary appear  normal. Small involuting cyst in left ovary is noted. There is no lymphadenopathy. No focal bony abnormality is identified. IMPRESSION: Diverticulosis with superimposed severe appearing sigmoid diverticulitis. A tiny abscess measuring 0.7 cm is identified in the left  pelvis. Free fluid in the pelvis is also seen. No drainable collection is identified. 0.7 cm nonobstructing stone lower pole left kidney. Electronically Signed   By: Inge Rise M.D.   On: 09/14/2015 11:30    ROS Blood pressure 117/69, pulse 99, temperature 99.4 F (37.4 C), temperature source Oral, resp. rate 16, height 5' 6"  (1.676 m), weight 86.183 kg (190 lb), last menstrual period 08/31/2015, SpO2 100 %.  12 system review of systems is performed.  It is notable for dry eyes, dry skin, chronic constipation, intermittent parotiditis.  Colonoscopic findings of diverticulosis and colon polyps 10 years ago.  Otherwise 12 system review of systems unremarkable except as described above.  Physical Exam  Gen.: She is alert and appears to be in minimal distress.  Partner is in room.  Alert and cooperative and oriented. Eyes: Sclera clear.  Reasonably moist ENMT: Mucous membranes are moist. Posterior pharynx clear of any exudate or lesions. Neck: normal, supple, no masses, no thyromegaly Respiratory: clear to auscultation bilaterally, no wheezing, no crackles. Normal respiratory effort. No accessory muscle use.  Cardiovascular: Regular rate and rhythm, no tachycardia no murmurs / rubs / gallops. No extremity edema. 2+ pedal pulses. No carotid bruits.  Abdomen: Mild obesity.  Not distended.  No mass.  Tender with guarding left lower quadrant.  Tender in suprapubic area but less.  Almost no tenderness right lower quadrant.  Upper abdomen soft. Musculoskeletal: no clubbing / cyanosis. No joint deformity upper and lower extremities. Good ROM, no contractures. Normal muscle tone.  Skin: no rashes, lesions, ulcers. No  induration Neurologic: CN 2-12 grossly intact. Sensation intact, DTR normal. Strength 5/5 in all 4.  Psychiatric: Normal judgment and insight. Alert and oriented x 3. Normal mood.    Assessment/Plan: Acute diverticulitis with possible tiny pericolonic abscess.  No evidence of peritonitis or drainable abscess.  No evidence of obstruction.   No indication for acute surgical intervention Recommend medical management with IV hydration, bowel rest, IV antibiotics Hopefully this will resolve with medical management and allow outpatient therapy If she does well I would recommend total of 14 days antibiotics Recommend outpatient colonoscopy in 6-8 weeks  Sjogren syndrome. Raynauds syndrome Mild obesity Chronic constipation  Traveon Louro M 09/14/2015, 5:12 PM

## 2015-09-14 NOTE — Progress Notes (Signed)
Presents with abdominal pain, ct abdomen with diverticulitis, small abscess. ED physician discussed with surgeon. Admission for IV antibiotics. Stable for med surgery . Consider suregry consultation  On arrival

## 2015-09-14 NOTE — ED Notes (Signed)
Pt reports low abd pain x 3 days. Had 3 normal BM this morning without relief of pain. Pt reports increased urinary frequency.

## 2015-09-14 NOTE — H&P (Signed)
History and Physical    Amanda Pugh DOB: [birth year] DOA: 09/14/2015  PCP: Birdena JubileeZANARD, ROBYN, Pugh  Patient coming from: Home.   Chief Complaint: Abdominal pain.   HPI: Amanda Pugh is a 45 y.o. female with medical history significant of  Sjogren syndrome, Raynaud diseases, on plaquenil who presents to [facility] complaining of abdominal pain, cramping in nature that started 2 days prior to admission. She report abdominal pain started in her lower quadrant, felt like her ovary pain. Last night the pain got worse. She also report 3 BM . Pain intermittent, waves of 15 minutes. One of the abdominal pain  episodes was so severe that she thought she was going to pass out. She has been taking flagyl for parotiditis    ED Course: WBC at 13, sodium 134, CT abdomen with diverticulosis with severe sigmoid diverticulitis, and small abscess 0.7 cm, free fluids in the pelvis.   Review of Systems: As per HPI otherwise 10 point review of systems negative.    Past Medical History  Diagnosis Date  . Anemia   . GERD (gastroesophageal reflux disease)   . Migraines   . Genital warts   . Genital herpes   . Sjoegren syndrome (HCC)   . Raynaud's disease     Past Surgical History  Procedure Laterality Date  . Bilateral salpingectomy       reports that she has never smoked. She has never used smokeless tobacco. She reports that she does not drink alcohol or use illicit drugs.  Allergies  Allergen Reactions  . Minocycline Shortness Of Breath    Family History  Problem Relation Age of Onset  . Hyperlipidemia Mother   . Hypertension Father   . Cancer Paternal Aunt     Breast Cancer  . Diabetes Maternal Grandmother      Prior to Admission medications   Medication Sig Start Date End Date Taking? Authorizing Provider  hydroxychloroquine (PLAQUENIL) 200 MG tablet Take 200 mg by mouth 2 (two) times daily.   Yes Historical Provider, Pugh  metroNIDAZOLE (FLAGYL) 500  MG tablet Take 500 mg by mouth 2 (two) times daily.   Yes Historical Provider, Pugh  AmLODIPine Besylate (NORVASC PO) Take by mouth.    Historical Provider, Pugh  cyclobenzaprine (FLEXERIL) 10 MG tablet Take 10 mg by mouth 3 (three) times daily as needed for muscle spasms.    Historical Provider, Pugh    Physical Exam: Filed Vitals:   09/14/15 1432 09/14/15 1433 09/14/15 1519 09/14/15 1602  BP: 115/78 115/78 123/73 117/69  Pulse: 97 98 96 99  Temp:   98.4 F (36.9 C) 99.4 F (37.4 C)  TempSrc:   Oral Oral  Resp: 16  16 16   Height:      Weight:      SpO2: 97% 97% 100% 100%      Constitutional: NAD, calm, comfortable Filed Vitals:   09/14/15 1432 09/14/15 1433 09/14/15 1519 09/14/15 1602  BP: 115/78 115/78 123/73 117/69  Pulse: 97 98 96 99  Temp:   98.4 F (36.9 C) 99.4 F (37.4 C)  TempSrc:   Oral Oral  Resp: 16  16 16   Height:      Weight:      SpO2: 97% 97% 100% 100%   Eyes: PERRL, lids and conjunctivae normal ENMT: Mucous membranes are moist. Posterior pharynx clear of any exudate or lesions.Normal dentition.  Neck: normal, supple, no masses, no thyromegaly Respiratory: clear to auscultation bilaterally, no wheezing, no crackles.  Normal respiratory effort. No accessory muscle use.  Cardiovascular: Regular rate and rhythm, no murmurs / rubs / gallops. No extremity edema. 2+ pedal pulses. No carotid bruits.  Abdomen: mild  tenderness, no masses palpated. No hepatosplenomegaly. Bowel sounds positive.  Musculoskeletal: no clubbing / cyanosis. No joint deformity upper and lower extremities. Good ROM, no contractures. Normal muscle tone.  Skin: no rashes, lesions, ulcers. No induration Neurologic: CN 2-12 grossly intact. Sensation intact, DTR normal. Strength 5/5 in all 4.  Psychiatric: Normal judgment and insight. Alert and oriented x 3. Normal mood.     Labs on Admission: I have personally reviewed following labs and imaging studies  CBC:  Recent Labs Lab 09/14/15 0925    WBC 13.6*  NEUTROABS 10.7*  HGB 13.3  HCT 39.1  MCV 90.5  PLT 237   Basic Metabolic Panel:  Recent Labs Lab 09/14/15 0925  NA 134*  K 4.0  CL 101  CO2 25  GLUCOSE 130*  BUN 7  CREATININE 0.82  CALCIUM 8.9   GFR: Estimated Creatinine Clearance: 97.9 mL/min (by C-G formula based on Cr of 0.82). Liver Function Tests:  Recent Labs Lab 09/14/15 0925  AST 19  ALT 19  ALKPHOS 45  BILITOT 0.9  PROT 7.5  ALBUMIN 4.0    Recent Labs Lab 09/14/15 0925  LIPASE 18   No results for input(s): AMMONIA in the last 168 hours. Coagulation Profile: No results for input(s): INR, PROTIME in the last 168 hours. Cardiac Enzymes: No results for input(s): CKTOTAL, CKMB, CKMBINDEX, TROPONINI in the last 168 hours. BNP (last 3 results) No results for input(s): PROBNP in the last 8760 hours. HbA1C: No results for input(s): HGBA1C in the last 72 hours. CBG: No results for input(s): GLUCAP in the last 168 hours. Lipid Profile: No results for input(s): CHOL, HDL, LDLCALC, TRIG, CHOLHDL, LDLDIRECT in the last 72 hours. Thyroid Function Tests: No results for input(s): TSH, T4TOTAL, FREET4, T3FREE, THYROIDAB in the last 72 hours. Anemia Panel: No results for input(s): VITAMINB12, FOLATE, FERRITIN, TIBC, IRON, RETICCTPCT in the last 72 hours. Urine analysis:    Component Value Date/Time   COLORURINE YELLOW 09/14/2015 0856   APPEARANCEUR CLEAR 09/14/2015 0856   LABSPEC 1.005 09/14/2015 0856   PHURINE 7.0 09/14/2015 0856   GLUCOSEU NEGATIVE 09/14/2015 0856   HGBUR NEGATIVE 09/14/2015 0856   BILIRUBINUR NEGATIVE 09/14/2015 0856   KETONESUR NEGATIVE 09/14/2015 0856   PROTEINUR NEGATIVE 09/14/2015 0856   NITRITE NEGATIVE 09/14/2015 0856   LEUKOCYTESUR NEGATIVE 09/14/2015 0856   Sepsis Labs: !!!!!!!!!!!!!!!!!!!!!!!!!!!!!!!!!!!!!!!!!!!! @LABRCNTIP (procalcitonin:4,lacticidven:4) ) Recent Results (from the past 240 hour(s))  Blood culture (routine x 2)     Status: None (Preliminary  result)   Collection Time: 09/14/15 12:25 PM  Result Value Ref Range Status   Specimen Description   Final    BLOOD LEFT ANTECUBITAL Performed at [facility]    Special Requests BOTTLES DRAWN AEROBIC AND ANAEROBIC 5CC  Final   Culture PENDING  Incomplete   Report Status PENDING  Incomplete     Radiological Exams on Admission: Ct Abdomen Pelvis W Contrast  09/14/2015  CLINICAL DATA:  Left lower quadrant pain for 3 days. EXAM: CT ABDOMEN AND PELVIS WITH CONTRAST TECHNIQUE: Multidetector CT imaging of the abdomen and pelvis was performed using the standard protocol following bolus administration of intravenous contrast. CONTRAST:  100 ml ISOVUE-300 IOPAMIDOL (ISOVUE-300) INJECTION 61% COMPARISON:  None. FINDINGS: The lung bases are clear.  No pleural or pericardial effusion. The liver, gallbladder, adrenal glands, spleen,  pancreas and right kidney appear normal. A nonobstructing 0.7 cm stone is seen in the lower pole of the left kidney. The patient has diverticulosis. Marked wall thickening and extensive inflammatory change are seen about the mid sigmoid colon consistent with acute diverticulitis. There is free fluid in the pelvis. A rim enhancing fluid collection measuring 0.7 cm in diameter compatible with a tiny abscess is seen in the left pelvis. No free intraperitoneal air is identified. The stomach and small bowel appear normal. The uterus and right ovary appear normal. Small involuting cyst in left ovary is noted. There is no lymphadenopathy. No focal bony abnormality is identified. IMPRESSION: Diverticulosis with superimposed severe appearing sigmoid diverticulitis. A tiny abscess measuring 0.7 cm is identified in the left pelvis. Free fluid in the pelvis is also seen. No drainable collection is identified. 0.7 cm nonobstructing stone lower pole left kidney. Electronically Signed   By: Drusilla Kanner M.D.   On: 09/14/2015 11:30      Assessment/Plan Principal Problem:    Diverticulitis Active Problems:   Sjogren's syndrome (HCC)  1-Diverticulitis , with small abscess;  Admit to Med-surgery.  IV fluids. IV zosyn.  NPO, allows ice.  Surgery consulted.   2-Sjogren, Raynaud . Hold plaquenil.   3-Mild hyponatremia; IV fluids.   4-Leukocytosis; secondary to number one.    DVT prophylaxis: lovenox.  Code Status: presume full code.  Family Communication: care discussed with patient  Disposition Plan: Discharge home in 3 to 4 days  Consults called: General surgery  Admission status: inpatient - med-surg    Alba Cory Pugh [facility] Pager [phone]  If 7PM-7AM, please contact night-coverage [email or web address] Password [email or web address]  09/14/2015, 4:06 PM

## 2015-09-14 NOTE — Care Management Note (Signed)
Case Management Note  Patient Details  Name: Ronn MelenaMelissa A Mullendore MRN: [id number] Date of Birth: 28-Jul-1970  Subjective/Objective:                  45 yo pt presents with abdominal pain, ct abdomen with diverticulitis, small abscess. ED physician discussed with surgeon. / From home with spouse.   Action/Plan: Admission for IV antibiotics. Follow for disposition needs.   Expected Discharge Date:  09/17/15               Expected Discharge Plan:  Home/Self Care  In-House Referral:  NA  Discharge planning Services  CM Consult  Post Acute Care Choice:    Choice offered to:     DME Arranged:    DME Agency:     HH Arranged:    HH Agency:     Status of Service:  In process, will continue to follow  If discussed at Long Length of Stay Meetings, dates discussed:    Additional Comments:  Oletta CohnWood, Damiean Lukes, RN 09/14/2015, 2:04 PM

## 2015-09-14 NOTE — ED Provider Notes (Signed)
CSN: [id number]     Arrival date & time 09/14/15  0850 History   First MD Initiated Contact with Patient 09/14/15 [phone]     Chief Complaint  Patient presents with  . Abdominal Pain     (Consider location/radiation/quality/duration/timing/severity/associated sxs/prior Treatment) HPI   Sunday developed abdominal pain, bilateral lower, now spreading up. Cramping pain. Hurts to sit, movement worse. 1BM Monday, 3 today, no diarrhea. Pain comes in waves every 10-15 minutes.  At its worse pain 9/10. This morning wrapped around right side.   Mild nausea.  Chills last night.  Felt feverish. No vomiting, no vaginal discharge, no urinary symptoms.   Past Medical History  Diagnosis Date  . Anemia   . GERD (gastroesophageal reflux disease)   . Migraines   . Genital warts   . Genital herpes   . Sjoegren syndrome (HCC)   . Raynaud's disease    Past Surgical History  Procedure Laterality Date  . Bilateral salpingectomy     Family History  Problem Relation Age of Onset  . Hyperlipidemia Mother   . Hypertension Father   . Cancer Paternal Aunt     Breast Cancer  . Diabetes Maternal Grandmother    Social History  Substance Use Topics  . Smoking status: Never Smoker   . Smokeless tobacco: Never Used  . Alcohol Use: No   OB History    No data available     Review of Systems  Constitutional: Positive for chills and appetite change. Negative for fever.  HENT: Negative for sore throat.   Eyes: Negative for visual disturbance.  Respiratory: Negative for cough and shortness of breath.   Cardiovascular: Negative for chest pain.  Gastrointestinal: Positive for nausea and abdominal pain. Negative for vomiting, diarrhea, constipation and blood in stool.  Genitourinary: Positive for flank pain. Negative for difficulty urinating.  Musculoskeletal: Negative for back pain and neck pain.  Skin: Negative for rash.  Neurological: Negative for syncope and headaches.      Allergies  Bupropion;  Minocycline; and Polyethylene glycol  Home Medications   Prior to Admission medications   Medication Sig Start Date End Date Taking? Authorizing Provider  cholecalciferol (VITAMIN D) 1000 units tablet Take 2,000 Units by mouth daily.   Yes Historical Provider, MD  fexofenadine (ALLEGRA ALLERGY) 180 MG tablet Take 180 mg by mouth daily as needed for allergies or rhinitis.   Yes Historical Provider, MD  hydroxychloroquine (PLAQUENIL) 200 MG tablet Take 200 mg by mouth 2 (two) times daily. 08/05/15 08/04/16 Yes Historical Provider, MD  ibuprofen (ADVIL,MOTRIN) 200 MG tablet Take 200 mg by mouth every 6 (six) hours as needed for headache or moderate pain.   Yes Historical Provider, MD  metroNIDAZOLE (FLAGYL) 500 MG tablet Take 500 mg by mouth 2 (two) times daily as needed (bacterial vaginosis).    Yes Historical Provider, MD  mometasone (NASONEX) 50 MCG/ACT nasal spray Place 2 sprays into the nose 2 (two) times daily as needed (allergies).   Yes Historical Provider, MD  Multiple Vitamin (MULTIVITAMIN WITH MINERALS) TABS tablet Take 1 tablet by mouth daily.   Yes Historical Provider, MD  OVER THE COUNTER MEDICATION Apply 1 application topically daily as needed (muscle aches). Magnesium oil   Yes Historical Provider, MD   BP 112/56 mmHg  Pulse 87  Temp(Src) 98.4 F (36.9 C) (Oral)  Resp 18  Ht  (1.676 m)  Wt 192 lb 3.2 oz (87.181 kg)  BMI 31.04 kg/m2  SpO2 97%  LMP 08/31/2015 Physical Exam  Constitutional: She is oriented to person, place, and time. She appears well-developed and well-nourished. No distress.  HENT:  Head: Normocephalic and atraumatic.  Eyes: Conjunctivae and EOM are normal.  Neck: Normal range of motion.  Cardiovascular: Normal rate, regular rhythm, normal heart sounds and intact distal pulses.  Exam reveals no gallop and no friction rub.   No murmur heard. Pulmonary/Chest: Effort normal and breath sounds normal. No respiratory distress. She has no wheezes. She has no  rales.  Abdominal: Soft. She exhibits no distension. There is tenderness (LLQ). There is no guarding.  Musculoskeletal: She exhibits no edema or tenderness.  Neurological: She is alert and oriented to person, place, and time.  Skin: Skin is warm and dry. No rash noted. She is not diaphoretic. No erythema.  Nursing note and vitals reviewed.   ED Course  Procedures (including critical care time) Labs Review Labs Reviewed  URINE CULTURE - Abnormal; Notable for the following:    Culture MULTIPLE SPECIES PRESENT, SUGGEST RECOLLECTION (*)    All other components within normal limits  CBC WITH DIFFERENTIAL/PLATELET - Abnormal; Notable for the following:    WBC 13.6 (*)    Neutro Abs 10.7 (*)    All other components within normal limits  COMPREHENSIVE METABOLIC PANEL - Abnormal; Notable for the following:    Sodium 134 (*)    Glucose, Bld 130 (*)    All other components within normal limits  COMPREHENSIVE METABOLIC PANEL - Abnormal; Notable for the following:    Potassium 3.3 (*)    Glucose, Bld 136 (*)    BUN 5 (*)    Calcium 7.8 (*)    Total Protein 6.4 (*)    Albumin 3.2 (*)    Alkaline Phosphatase 37 (*)    All other components within normal limits  CBC - Abnormal; Notable for the following:    RBC 3.69 (*)    Hemoglobin 11.2 (*)    HCT 33.8 (*)    All other components within normal limits  CULTURE, BLOOD (ROUTINE X 2)  CULTURE, BLOOD (ROUTINE X 2)  LIPASE, BLOOD  URINALYSIS, ROUTINE W REFLEX MICROSCOPIC (NOT AT [facility])  PREGNANCY, URINE  LACTIC ACID, PLASMA  LACTIC ACID, PLASMA    Imaging Review Ct Abdomen Pelvis W Contrast  09/14/2015  CLINICAL DATA:  Left lower quadrant pain for 3 days. EXAM: CT ABDOMEN AND PELVIS WITH CONTRAST TECHNIQUE: Multidetector CT imaging of the abdomen and pelvis was performed using the standard protocol following bolus administration of intravenous contrast. CONTRAST:  100 ml ISOVUE-300 IOPAMIDOL (ISOVUE-300) INJECTION 61% COMPARISON:  None.  FINDINGS: The lung bases are clear.  No pleural or pericardial effusion. The liver, gallbladder, adrenal glands, spleen, pancreas and right kidney appear normal. A nonobstructing 0.7 cm stone is seen in the lower pole of the left kidney. The patient has diverticulosis. Marked wall thickening and extensive inflammatory change are seen about the mid sigmoid colon consistent with acute diverticulitis. There is free fluid in the pelvis. A rim enhancing fluid collection measuring 0.7 cm in diameter compatible with a tiny abscess is seen in the left pelvis. No free intraperitoneal air is identified. The stomach and small bowel appear normal. The uterus and right ovary appear normal. Small involuting cyst in left ovary is noted. There is no lymphadenopathy. No focal bony abnormality is identified. IMPRESSION: Diverticulosis with superimposed severe appearing sigmoid diverticulitis. A tiny abscess measuring 0.7 cm is identified in the left pelvis. Free fluid in the pelvis is also seen. No drainable  collection is identified. 0.7 cm nonobstructing stone lower pole left kidney. Electronically Signed   By: Drusilla Kanner M.D.   On: 09/14/2015 11:30   I have personally reviewed and evaluated these images and lab results as part of my medical decision-making.   EKG Interpretation None      MDM   Final diagnoses:  Diverticulitis of large intestine with abscess without bleeding   45yo female with history of diverticulosis, sjoegren's, presents with concern for lower abdominal pain.  CT abd pelvis shows diverticulitis with small abscess. Initiated zosyn. Discussed with Dr. Derrell Lolling who recommends continued IV abx, monitoring and if pt not improving surgery may be consulted. Pt hemodynamically stable and admitted to [facility] for further care.   Alvira Monday, MD 09/15/15 1245

## 2015-09-15 DIAGNOSIS — M3500 Sicca syndrome, unspecified: Secondary | ICD-10-CM

## 2015-09-15 DIAGNOSIS — K5720 Diverticulitis of large intestine with perforation and abscess without bleeding: Principal | ICD-10-CM

## 2015-09-15 LAB — COMPREHENSIVE METABOLIC PANEL
ALK PHOS: 37 U/L — AB (ref 38–126)
ALT: 14 U/L (ref 14–54)
AST: 15 U/L (ref 15–41)
Albumin: 3.2 g/dL — ABNORMAL LOW (ref 3.5–5.0)
Anion gap: 6 (ref 5–15)
BUN: 5 mg/dL — AB (ref 6–20)
CHLORIDE: 105 mmol/L (ref 101–111)
CO2: 25 mmol/L (ref 22–32)
CREATININE: 0.62 mg/dL (ref 0.44–1.00)
Calcium: 7.8 mg/dL — ABNORMAL LOW (ref 8.9–10.3)
GFR calc Af Amer: >60 mL/min (ref >60)
GFR calc non Af Amer: >60 mL/min (ref >60)
GLUCOSE: 136 mg/dL — AB (ref 65–99)
Potassium: 3.3 mmol/L — ABNORMAL LOW (ref 3.5–5.1)
SODIUM: 136 mmol/L (ref 135–145)
Total Bilirubin: 0.9 mg/dL (ref 0.3–1.2)
Total Protein: 6.4 g/dL — ABNORMAL LOW (ref 6.5–8.1)

## 2015-09-15 LAB — CBC
HCT: 33.8 % — ABNORMAL LOW (ref 36.0–46.0)
Hemoglobin: 11.2 g/dL — ABNORMAL LOW (ref 12.0–15.0)
MCH: 30.4 pg (ref 26.0–34.0)
MCHC: 33.1 g/dL (ref 30.0–36.0)
MCV: 91.6 fL (ref 78.0–100.0)
PLATELETS: 207 10*3/uL (ref 150–400)
RBC: 3.69 MIL/uL — ABNORMAL LOW (ref 3.87–5.11)
RDW: 12.9 % (ref 11.5–15.5)
WBC: 8.7 10*3/uL (ref 4.0–10.5)

## 2015-09-15 LAB — URINE CULTURE

## 2015-09-15 MED ORDER — POTASSIUM CHLORIDE 2 MEQ/ML IV SOLN
INTRAVENOUS | Status: DC
  Administered 2015-09-15 – 2015-09-17 (×3): via INTRAVENOUS
  Filled 2015-09-15 (×9): qty 1000

## 2015-09-15 NOTE — Progress Notes (Signed)
Subjective: Feels much better.  Pain less but not resolved.  No nausea.  Voiding well but says urine is dark.  Labs reveal potassium 3.3.  Creatinine 0.62.  WBC down to 8700.  Hemoglobin 11.2 following hydration.  Objective: Vital signs in last 24 hours: Temp:  [98.4 F (36.9 C)-99.8 F (37.7 C)] 98.4 F (36.9 C) (07/12 0553) Pulse Rate:  [52-104] 87 (07/12 0553) Resp:  [16-18] 18 (07/12 0553) BP: (112-144)/(56-85) 112/56 mmHg (07/12 0553) SpO2:  [97 %-100 %] 97 % (07/12 0553) Weight:  [86.183 kg (190 lb)-87.181 kg (192 lb 3.2 oz)] 87.181 kg (192 lb 3.2 oz) (07/12 0553) Last BM Date: 09/14/15  Intake/Output from previous day: 07/11 0701 - 07/12 0700 In: 1620 [I.V.:1620] Out: 775 [Urine:775] Intake/Output this shift:    General appearance: Alert and cooperative.  Appears somewhat fatigued but not in any acute distress. Resp: clear to auscultation bilaterally GI: Soft and nondistended.  Still tender suprapubic and left lower quadrant.  No mass.  Lab Results:   Recent Labs  09/14/15 0925 09/15/15 0434  WBC 13.6* 8.7  HGB 13.3 11.2*  HCT 39.1 33.8*  PLT 237 207   BMET  Recent Labs  09/14/15 0925 09/15/15 0434  NA 134* 136  K 4.0 3.3*  CL 101 105  CO2 25 25  GLUCOSE 130* 136*  BUN 7 5*  CREATININE 0.82 0.62  CALCIUM 8.9 7.8*   PT/INR No results for input(s): LABPROT, INR in the last 72 hours. ABG No results for input(s): PHART, HCO3 in the last 72 hours.  Invalid input(s): PCO2, PO2  Studies/Results: Ct Abdomen Pelvis W Contrast  09/14/2015  CLINICAL DATA:  Left lower quadrant pain for 3 days. EXAM: CT ABDOMEN AND PELVIS WITH CONTRAST TECHNIQUE: Multidetector CT imaging of the abdomen and pelvis was performed using the standard protocol following bolus administration of intravenous contrast. CONTRAST:  100 ml ISOVUE-300 IOPAMIDOL (ISOVUE-300) INJECTION 61% COMPARISON:  None. FINDINGS: The lung bases are clear.  No pleural or pericardial effusion. The  liver, gallbladder, adrenal glands, spleen, pancreas and right kidney appear normal. A nonobstructing 0.7 cm stone is seen in the lower pole of the left kidney. The patient has diverticulosis. Marked wall thickening and extensive inflammatory change are seen about the mid sigmoid colon consistent with acute diverticulitis. There is free fluid in the pelvis. A rim enhancing fluid collection measuring 0.7 cm in diameter compatible with a tiny abscess is seen in the left pelvis. No free intraperitoneal air is identified. The stomach and small bowel appear normal. The uterus and right ovary appear normal. Small involuting cyst in left ovary is noted. There is no lymphadenopathy. No focal bony abnormality is identified. IMPRESSION: Diverticulosis with superimposed severe appearing sigmoid diverticulitis. A tiny abscess measuring 0.7 cm is identified in the left pelvis. Free fluid in the pelvis is also seen. No drainable collection is identified. 0.7 cm nonobstructing stone lower pole left kidney. Electronically Signed   By: Drusilla Kanner M.D.   On: 09/14/2015 11:30    Anti-infectives: Anti-infectives    Start     Dose/Rate Route Frequency Ordered Stop   09/14/15 2000  piperacillin-tazobactam (ZOSYN) IVPB 3.375 g     3.375 g 12.5 mL/hr over 240 Minutes Intravenous Every 8 hours 09/14/15 1551     09/14/15 1215  piperacillin-tazobactam (ZOSYN) IVPB 3.375 g     3.375 g 100 mL/hr over 30 Minutes Intravenous  Once 09/14/15 1213 09/14/15 1329      Assessment/Plan:   Acute diverticulitis  with possible tiny pericolonic abscess. No evidence of peritonitis or drainable abscess. No evidence of obstruction.  No indication for acute surgical intervention Recommend medical management with IV hydration,  IV antibiotics Allow clear liquid diet today. Hopefully this will resolve with medical management and allow outpatient therapy If she does well I would recommend total of 14 days antibiotics Recommend  outpatient colonoscopy in 6-8 weeks  Hypokalemia.  I have changed her IV fluids to increase potassium supplementation.  Sjogren syndrome. Raynauds syndrome Mild obesity Chronic constipation   LOS: 1 day    Rachelanne Whidby M 09/15/2015

## 2015-09-15 NOTE — Progress Notes (Signed)
PROGRESS NOTE  Amanda Pugh:[id number] DOB: [birth year] DOA: 09/14/2015 PCP: Birdena Jubilee, MD   LOS: 1 day   Brief Narrative: 45 y.o. female with medical history significant of Sjogren syndrome, Raynaud diseases, on plaquenil who presents to [facility] complaining of abdominal pain, cramping in nature that started 2 days prior to admission. CT abdomen with diverticulosis with severe sigmoid diverticulitis, and small abscess 0.7 cm, free fluids in the pelvis. General surgery consulted  Assessment & Plan: Principal Problem:   Diverticulitis Active Problems:   Sjogren's syndrome (HCC)   Diverticulitis / small abscess - general surgery following, continue antibiotics - allow clear liquid - continue IV antibiotics  Sjogren, Raynaud  - outpatient management  Hypokalemia - replete IV  Mild dilutional anemia - noted.  Hyponatremia - resolved with hydration   DVT prophylaxis: Lovenox Code Status: Full Family Communication: no family bedside Disposition Plan: home when ready  Consultants:   General Surgery  Procedures:   None   Antimicrobials:  Zosyn 7/11 >>   Subjective: - no chest pain, shortness of breath, no nausea or vomiting.  - mild abdominal pain  Objective: Filed Vitals:   09/14/15 1519 09/14/15 1602 09/14/15 2131 09/15/15 0553  BP: 123/73 117/69 112/64 112/56  Pulse: 96 99 52 87  Temp: 98.4 F (36.9 C) 99.4 F (37.4 C) 99.8 F (37.7 C) 98.4 F (36.9 C)  TempSrc: Oral Oral Oral Oral  Resp: 16 16 17 18   Height:      Weight:    87.181 kg (192 lb 3.2 oz)  SpO2: 100% 100% 100% 97%    Intake/Output Summary (Last 24 hours) at 09/15/15 1339 Last data filed at 09/15/15 1000  Gross per 24 hour  Intake   1620 ml  Output    975 ml  Net    645 ml   Filed Weights   09/14/15 0855 09/15/15 0553  Weight: 86.183 kg (190 lb) 87.181 kg (192 lb 3.2 oz)    Examination: Constitutional: NAD Filed Vitals:   09/14/15 1519 09/14/15  1602 09/14/15 2131 09/15/15 0553  BP: 123/73 117/69 112/64 112/56  Pulse: 96 99 52 87  Temp: 98.4 F (36.9 C) 99.4 F (37.4 C) 99.8 F (37.7 C) 98.4 F (36.9 C)  TempSrc: Oral Oral Oral Oral  Resp: 16 16 17 18   Height:      Weight:    87.181 kg (192 lb 3.2 oz)  SpO2: 100% 100% 100% 97%   Eyes: PERRL Respiratory: clear to auscultation bilaterally, no wheezing, no crackles.  Cardiovascular: Regular rate and rhythm, no murmurs / rubs / gallops.  Abdomen: + mild tenderness. Bowel sounds positive.  Musculoskeletal: no clubbing / cyanosis. No joint deformity upper and lower extremities. Skin: no rashes, lesions, ulcers. No induration Neurologic: non focal    Data Reviewed: I have personally reviewed following labs and imaging studies  CBC:  Recent Labs Lab 09/14/15 0925 09/15/15 0434  WBC 13.6* 8.7  NEUTROABS 10.7*  --   HGB 13.3 11.2*  HCT 39.1 33.8*  MCV 90.5 91.6  PLT 237 207   Basic Metabolic Panel:  Recent Labs Lab 09/14/15 0925 09/15/15 0434  NA 134* 136  K 4.0 3.3*  CL 101 105  CO2 25 25  GLUCOSE 130* 136*  BUN 7 5*  CREATININE 0.82 0.62  CALCIUM 8.9 7.8*   GFR: Estimated Creatinine Clearance: 100.9 mL/min (by C-G formula based on Cr of 0.62). Liver Function Tests:  Recent Labs Lab 09/14/15 0925 09/15/15 0434  AST 19 15  ALT 19 14  ALKPHOS 45 37*  BILITOT 0.9 0.9  PROT 7.5 6.4*  ALBUMIN 4.0 3.2*    Recent Labs Lab 09/14/15 0925  LIPASE 18   No results for input(s): AMMONIA in the last 168 hours. Coagulation Profile: No results for input(s): INR, PROTIME in the last 168 hours. Cardiac Enzymes: No results for input(s): CKTOTAL, CKMB, CKMBINDEX, TROPONINI in the last 168 hours. BNP (last 3 results) No results for input(s): PROBNP in the last 8760 hours. HbA1C: No results for input(s): HGBA1C in the last 72 hours. CBG: No results for input(s): GLUCAP in the last 168 hours. Lipid Profile: No results for input(s): CHOL, HDL, LDLCALC,  TRIG, CHOLHDL, LDLDIRECT in the last 72 hours. Thyroid Function Tests: No results for input(s): TSH, T4TOTAL, FREET4, T3FREE, THYROIDAB in the last 72 hours. Anemia Panel: No results for input(s): VITAMINB12, FOLATE, FERRITIN, TIBC, IRON, RETICCTPCT in the last 72 hours. Urine analysis:    Component Value Date/Time   COLORURINE YELLOW 09/14/2015 0856   APPEARANCEUR CLEAR 09/14/2015 0856   LABSPEC 1.005 09/14/2015 0856   PHURINE 7.0 09/14/2015 0856   GLUCOSEU NEGATIVE 09/14/2015 0856   HGBUR NEGATIVE 09/14/2015 0856   BILIRUBINUR NEGATIVE 09/14/2015 0856   KETONESUR NEGATIVE 09/14/2015 0856   PROTEINUR NEGATIVE 09/14/2015 0856   NITRITE NEGATIVE 09/14/2015 0856   LEUKOCYTESUR NEGATIVE 09/14/2015 0856   Sepsis Labs: Invalid input(s): PROCALCITONIN, LACTICIDVEN  Recent Results (from the past 240 hour(s))  Urine culture     Status: Abnormal   Collection Time: 09/14/15  8:56 AM  Result Value Ref Range Status   Specimen Description URINE, CLEAN CATCH  Final   Special Requests NONE  Final   Culture MULTIPLE SPECIES PRESENT, SUGGEST RECOLLECTION (A)  Final   Report Status 09/15/2015 FINAL  Final  Blood culture (routine x 2)     Status: None (Preliminary result)   Collection Time: 09/14/15 12:25 PM  Result Value Ref Range Status   Specimen Description   Final    BLOOD LEFT ANTECUBITAL Performed at [facility]    Special Requests BOTTLES DRAWN AEROBIC AND ANAEROBIC 5CC  Final   Culture PENDING  Incomplete   Report Status PENDING  Incomplete      Radiology Studies: Ct Abdomen Pelvis W Contrast  09/14/2015  CLINICAL DATA:  Left lower quadrant pain for 3 days. EXAM: CT ABDOMEN AND PELVIS WITH CONTRAST TECHNIQUE: Multidetector CT imaging of the abdomen and pelvis was performed using the standard protocol following bolus administration of intravenous contrast. CONTRAST:  100 ml ISOVUE-300 IOPAMIDOL (ISOVUE-300) INJECTION 61% COMPARISON:  None. FINDINGS: The lung bases are  clear.  No pleural or pericardial effusion. The liver, gallbladder, adrenal glands, spleen, pancreas and right kidney appear normal. A nonobstructing 0.7 cm stone is seen in the lower pole of the left kidney. The patient has diverticulosis. Marked wall thickening and extensive inflammatory change are seen about the mid sigmoid colon consistent with acute diverticulitis. There is free fluid in the pelvis. A rim enhancing fluid collection measuring 0.7 cm in diameter compatible with a tiny abscess is seen in the left pelvis. No free intraperitoneal air is identified. The stomach and small bowel appear normal. The uterus and right ovary appear normal. Small involuting cyst in left ovary is noted. There is no lymphadenopathy. No focal bony abnormality is identified. IMPRESSION: Diverticulosis with superimposed severe appearing sigmoid diverticulitis. A tiny abscess measuring 0.7 cm is identified in the left pelvis. Free fluid in the pelvis is  also seen. No drainable collection is identified. 0.7 cm nonobstructing stone lower pole left kidney. Electronically Signed   By: Drusilla Kannerhomas  Dalessio M.D.   On: 09/14/2015 11:30     Scheduled Meds: . enoxaparin (LOVENOX) injection  40 mg Subcutaneous Q24H  . piperacillin-tazobactam (ZOSYN)  IV  3.375 g Intravenous Q8H   Continuous Infusions: . lactated ringers 1,000 mL with potassium chloride 20 mEq infusion 125 mL/hr at 09/15/15 1007    Pamella Pertostin Paco Cislo, MD, PhD [facility] Pager [phone]  If 7PM-7AM, please contact night-coverage [email or web address] Password [email or web address] 09/15/2015, 1:39 PM

## 2015-09-16 LAB — BASIC METABOLIC PANEL
Anion gap: 5 (ref 5–15)
CALCIUM: 8.4 mg/dL — AB (ref 8.9–10.3)
CO2: 26 mmol/L (ref 22–32)
Chloride: 106 mmol/L (ref 101–111)
Creatinine, Ser: 0.66 mg/dL (ref 0.44–1.00)
GFR calc Af Amer: >60 mL/min (ref >60)
GLUCOSE: 101 mg/dL — AB (ref 65–99)
POTASSIUM: 3.9 mmol/L (ref 3.5–5.1)
Sodium: 137 mmol/L (ref 135–145)

## 2015-09-16 NOTE — Progress Notes (Signed)
PROGRESS NOTE  Amanda Pugh YQM:[id number] DOB: [birth year] DOA: 09/14/2015 PCP: Birdena Jubilee, MD   LOS: 2 days   Brief Narrative: 45 y.o. female with medical history significant of Sjogren syndrome, Raynaud diseases, on plaquenil who presents to [facility] ED complaining of abdominal pain, cramping in nature that started 2 days prior to admission. CT abdomen with diverticulosis with severe sigmoid diverticulitis, and small abscess 0.7 cm, free fluids in the pelvis. General surgery consulted  Assessment & Plan: Principal Problem:   Diverticulitis Active Problems:   Sjogren's syndrome (HCC)   Diverticulitis / small abscess - general surgery following, continue antibiotics - advance diet - continue IV antibiotics - improving  Sjogren, Raynaud  - outpatient management  Hypokalemia - resolved  Mild dilutional anemia - noted.  Hyponatremia - resolved with hydration   DVT prophylaxis: Lovenox Code Status: Full Family Communication: significant other bedside Disposition Plan: home when ready  Consultants:   General Surgery  Procedures:   None   Antimicrobials:  Zosyn 7/11 >>   Subjective: - no chest pain, shortness of breath, no nausea or vomiting.  - no abdominal pain  Objective: Filed Vitals:   09/15/15 1400 09/15/15 2111 09/16/15 0557 09/16/15 0636  BP: 119/68 118/52 119/65   Pulse: 74 80 80   Temp: 98.3 F (36.8 C) 98.8 F (37.1 C) 98.5 F (36.9 C)   TempSrc: Oral Oral Oral   Resp: Height:      Weight:    86.592 kg (190 lb 14.4 oz)  SpO2: 99% 100% 98%     Intake/Output Summary (Last 24 hours) at 09/16/15 1336 Last data filed at 09/16/15 1006  Gross per 24 hour  Intake 3085.42 ml  Output   3150 ml  Net -64.58 ml   Filed Weights   09/14/15 0855 09/15/15 0553 09/16/15 0636  Weight: 86.183 kg (190 lb) 87.181 kg (192 lb 3.2 oz) 86.592 kg (190 lb 14.4 oz)    Examination: Constitutional: NAD Filed Vitals:   09/15/15 1400 09/15/15 2111 09/16/15 0557 09/16/15 0636  BP: 119/68 118/52 119/65   Pulse: 74 80 80   Temp: 98.3 F (36.8 C) 98.8 F (37.1 C) 98.5 F (36.9 C)   TempSrc: Oral Oral Oral   Resp: Height:      Weight:    86.592 kg (190 lb 14.4 oz)  SpO2: 99% 100% 98%    Eyes: PERRL Respiratory: clear to auscultation bilaterally, no wheezing, no crackles.  Cardiovascular: Regular rate and rhythm, no murmurs / rubs / gallops.  Abdomen: + mild tenderness. Bowel sounds positive.    Data Reviewed: I have personally reviewed following labs and imaging studies  CBC:  Recent Labs Lab 09/14/15 0925 09/15/15 0434  WBC 13.6* 8.7  NEUTROABS 10.7*  --   HGB 13.3 11.2*  HCT 39.1 33.8*  MCV 90.5 91.6  PLT 237 207   Basic Metabolic Panel:  Recent Labs Lab 09/14/15 0925 09/15/15 0434 09/16/15 0425  NA 134* 136 137  K 4.0 3.3* 3.9  CL 101 105 106  CO2 GLUCOSE 130* 136* 101*  BUN 7 5* <5*  CREATININE 0.82 0.62 0.66  CALCIUM 8.9 7.8* 8.4*   GFR: Estimated Creatinine Clearance: 100.5 mL/min (by C-G formula based on Cr of 0.66). Liver Function Tests:  Recent Labs Lab 09/14/15 0925 09/15/15 0434  AST 19 15  ALT 19 14  ALKPHOS 45 37*  BILITOT 0.9 0.9  PROT  7.5 6.4*  ALBUMIN 4.0 3.2*    Recent Labs Lab 09/14/15 0925  LIPASE 18   No results for input(s): AMMONIA in the last 168 hours. Coagulation Profile: No results for input(s): INR, PROTIME in the last 168 hours. Cardiac Enzymes: No results for input(s): CKTOTAL, CKMB, CKMBINDEX, TROPONINI in the last 168 hours. BNP (last 3 results) No results for input(s): PROBNP in the last 8760 hours. HbA1C: No results for input(s): HGBA1C in the last 72 hours. CBG: No results for input(s): GLUCAP in the last 168 hours. Lipid Profile: No results for input(s): CHOL, HDL, LDLCALC, TRIG, CHOLHDL, LDLDIRECT in the last 72 hours. Thyroid Function Tests: No results for input(s): TSH, T4TOTAL, FREET4, T3FREE,  THYROIDAB in the last 72 hours. Anemia Panel: No results for input(s): VITAMINB12, FOLATE, FERRITIN, TIBC, IRON, RETICCTPCT in the last 72 hours. Urine analysis:    Component Value Date/Time   COLORURINE YELLOW 09/14/2015 0856   APPEARANCEUR CLEAR 09/14/2015 0856   LABSPEC 1.005 09/14/2015 0856   PHURINE 7.0 09/14/2015 0856   GLUCOSEU NEGATIVE 09/14/2015 0856   HGBUR NEGATIVE 09/14/2015 0856   BILIRUBINUR NEGATIVE 09/14/2015 0856   KETONESUR NEGATIVE 09/14/2015 0856   PROTEINUR NEGATIVE 09/14/2015 0856   NITRITE NEGATIVE 09/14/2015 0856   LEUKOCYTESUR NEGATIVE 09/14/2015 0856   Sepsis Labs: Invalid input(s): PROCALCITONIN, LACTICIDVEN  Recent Results (from the past 240 hour(s))  Urine culture     Status: Abnormal   Collection Time: 09/14/15  8:56 AM  Result Value Ref Range Status   Specimen Description URINE, CLEAN CATCH  Final   Special Requests NONE  Final   Culture MULTIPLE SPECIES PRESENT, SUGGEST RECOLLECTION (A)  Final   Report Status 09/15/2015 FINAL  Final  Blood culture (routine x 2)     Status: None (Preliminary result)   Collection Time: 09/14/15 12:20 PM  Result Value Ref Range Status   Spec[facility]  Final   Special Requests BOTTLES DRAWN AEROBIC AND ANAEROBIC 5CC  Final   Culture   Final    NO GROWTH 1 DAY Performed at [facility]    Report Status PENDING  Incomplete  Blood culture (routine x 2)     Status: None (Preliminary result)   Collection Time: 09/14/15 12:25 PM  Result Value Ref Range Status   Specimen Description BLOOD LEFT ANTECUBITAL  Final   Special Requests BOTTLES DRAWN AEROBIC AND ANAEROBIC 5CC  Final   Culture   Final    NO GROWTH 1 DAY [facility]    Report Status PENDING  Incomplete      Radiology Studies: No results found.   Scheduled Meds: . enoxaparin (LOVENOX) injection  40 mg Subcutaneous Q24H  . piperacillin-tazobactam (ZOSYN)  IV  3.375 g Intravenous Q8H   Continuous  Infusions: . lactated ringers 1,000 mL with potassium chloride 20 mEq infusion 75 mL/hr at 09/16/15 [id number]    Pamella Pertostin Tai Skelly, MD, PhD [facility] Pager [phone]  If 7PM-7AM, please contact night-coverage [email or web address] Password [email or web address] 09/16/2015, 1:36 PM

## 2015-09-16 NOTE — Progress Notes (Signed)
Subjective: Slowly improving.  Still has some pain but not bad.  Ambulating in halls.  Voiding without difficulty.  Tolerating clear liquids without nausea. She has passed flatus a few times but no stool. Afebrile.  Stable.  Objective: Vital signs in last 24 hours: Temp:  [98.3 F (36.8 C)-98.8 F (37.1 C)] 98.5 F (36.9 C) (07/13 0557) Pulse Rate:  [74-80] 80 (07/13 0557) Resp:  [16-18] 16 (07/13 0557) BP: (118-119)/(52-68) 119/65 mmHg (07/13 0557) SpO2:  [98 %-100 %] 98 % (07/13 0557) Last BM Date: 09/14/15  Intake/Output from previous day: 07/12 0701 - 07/13 0700 In: 3085.4 [P.O.:600; I.V.:2485.4] Out: 2800 [Urine:2800] Intake/Output this shift: Total I/O In: 1625 [I.V.:1625] Out: 1600 [Urine:1600]   General appearance: Alert and cooperative.not in any acute distress. Resp: clear to auscultation bilaterally GI: Soft and nondistended.Suprapubic tenderness is less.  No mass.  Minimal guarding.   Lab Results:   Recent Labs  09/14/15 0925 09/15/15 0434  WBC 13.6* 8.7  HGB 13.3 11.2*  HCT 39.1 33.8*  PLT 237 207   BMET  Recent Labs  09/15/15 0434 09/16/15 0425  NA 136 137  K 3.3* 3.9  CL 105 106  CO2 25 26  GLUCOSE 136* 101*  BUN 5* <5*  CREATININE 0.62 0.66  CALCIUM 7.8* 8.4*   PT/INR No results for input(s): LABPROT, INR in the last 72 hours. ABG No results for input(s): PHART, HCO3 in the last 72 hours.  Invalid input(s): PCO2, PO2  Studies/Results: Ct Abdomen Pelvis W Contrast  09/14/2015  CLINICAL DATA:  Left lower quadrant pain for 3 days. EXAM: CT ABDOMEN AND PELVIS WITH CONTRAST TECHNIQUE: Multidetector CT imaging of the abdomen and pelvis was performed using the standard protocol following bolus administration of intravenous contrast. CONTRAST:  100 ml ISOVUE-300 IOPAMIDOL (ISOVUE-300) INJECTION 61% COMPARISON:  None. FINDINGS: The lung bases are clear.  No pleural or pericardial effusion. The liver, gallbladder, adrenal glands, spleen,  pancreas and right kidney appear normal. A nonobstructing 0.7 cm stone is seen in the lower pole of the left kidney. The patient has diverticulosis. Marked wall thickening and extensive inflammatory change are seen about the mid sigmoid colon consistent with acute diverticulitis. There is free fluid in the pelvis. A rim enhancing fluid collection measuring 0.7 cm in diameter compatible with a tiny abscess is seen in the left pelvis. No free intraperitoneal air is identified. The stomach and small bowel appear normal. The uterus and right ovary appear normal. Small involuting cyst in left ovary is noted. There is no lymphadenopathy. No focal bony abnormality is identified. IMPRESSION: Diverticulosis with superimposed severe appearing sigmoid diverticulitis. A tiny abscess measuring 0.7 cm is identified in the left pelvis. Free fluid in the pelvis is also seen. No drainable collection is identified. 0.7 cm nonobstructing stone lower pole left kidney. Electronically Signed   By: Drusilla Kannerhomas  Dalessio M.D.   On: 09/14/2015 11:30    Anti-infectives: Anti-infectives    Start     Dose/Rate Route Frequency Ordered Stop   09/14/15 2000  piperacillin-tazobactam (ZOSYN) IVPB 3.375 g     3.375 g 12.5 mL/hr over 240 Minutes Intravenous Every 8 hours 09/14/15 1551     09/14/15 1215  piperacillin-tazobactam (ZOSYN) IVPB 3.375 g     3.375 g 100 mL/hr over 30 Minutes Intravenous  Once 09/14/15 1213 09/14/15 1329      Assessment/Plan:  Acute diverticulitis with possible tiny pericolonic abscess. No evidence of peritonitis or drainable abscess. No evidence of obstruction.  She is responding  appropriately to medical management. No indication for acute surgical intervention Recommend medical management with IV hydration, IV antibiotics Advance to full liquid diet today.  Once she begins having stools, we can advance to soft diet.  We'll hold off for now. Hopefully this will resolve with medical management and allow  outpatient therapy If she does well I would recommend total of 14 days antibiotics Recommend outpatient colonoscopy in 6-8 weeks  Hypokalemia. K 3.9 today, resolved DVT prophylaxis-lovenox Sjogren syndrome. Raynauds syndrome Mild obesity Chronic constipation   LOS: 2 days    Maryama Kuriakose M 09/16/2015

## 2015-09-17 MED ORDER — AMOXICILLIN-POT CLAVULANATE 875-125 MG PO TABS: 1.0000 | ORAL_TABLET | Freq: Two times a day (BID) | ORAL | Status: DC

## 2015-09-17 MED ORDER — SENNA 8.6 MG PO TABS
1.0000 | ORAL_TABLET | Freq: Every day | ORAL | Status: DC
  Administered 2015-09-17: 8.6 mg via ORAL
  Filled 2015-09-17: qty 1

## 2015-09-17 MED ORDER — AMOXICILLIN-POT CLAVULANATE 875-125 MG PO TABS
1.0000 | ORAL_TABLET | Freq: Two times a day (BID) | ORAL | Status: DC
  Administered 2015-09-17: 1 via ORAL
  Filled 2015-09-17 (×2): qty 1

## 2015-09-17 NOTE — Progress Notes (Signed)
Discharge instructions discussed with patient and husband until all questions answered. Am assessment unchanged. IV removed . Script given

## 2015-09-17 NOTE — Progress Notes (Signed)
  Subjective: Feels much better.  Wants to go home. Had a stool.  Still on full liquids.  Afebrile.  Objective: Vital signs in last 24 hours: Temp:  [98.4 F (36.9 C)-98.5 F (36.9 C)] 98.5 F (36.9 C) (07/14 0606) Pulse Rate:  [74-81] 78 (07/14 0606) Resp:  [15-16] 15 (07/14 0606) BP: (114-125)/(61-66) 114/61 mmHg (07/14 0606) SpO2:  [99 %-100 %] 100 % (07/14 0606) Weight:  [86.592 kg (190 lb 14.4 oz)] 86.592 kg (190 lb 14.4 oz) (07/13 0636) Last BM Date: 09/16/15  Intake/Output from previous day: 07/13 0701 - 07/14 0700 In: 2371.7 [P.O.:240; I.V.:1831.7; IV Piggyback:300] Out: 1250 [Urine:1250] Intake/Output this shift: Total I/O In: 1192.5 [I.V.:1192.5] Out: 400 [Urine:400]  General appearance: Alert.  Very pleasant.  Cooperative.  No distress Resp: clear to auscultation bilaterally GI: Much softer.  Nondistended.  Minimally tender in suprapubic area.  This is subjective.  There is no guarding or mass.  Lab Results:   Recent Labs  09/14/15 0925 09/15/15 0434  WBC 13.6* 8.7  HGB 13.3 11.2*  HCT 39.1 33.8*  PLT 237 207   BMET  Recent Labs  09/15/15 0434 09/16/15 0425  NA 136 137  K 3.3* 3.9  CL 105 106  CO2 25 26  GLUCOSE 136* 101*  BUN 5* <5*  CREATININE 0.62 0.66  CALCIUM 7.8* 8.4*   PT/INR No results for input(s): LABPROT, INR in the last 72 hours. ABG No results for input(s): PHART, HCO3 in the last 72 hours.  Invalid input(s): PCO2, PO2  Studies/Results: No results found.  Anti-infectives: Anti-infectives    Start     Dose/Rate Route Frequency Ordered Stop   09/17/15 1000  amoxicillin-clavulanate (AUGMENTIN) 875-125 MG per tablet 1 tablet     1 tablet Oral Every 12 hours 09/17/15 0625     09/14/15 2000  piperacillin-tazobactam (ZOSYN) IVPB 3.375 g  Status:  Discontinued     3.375 g 12.5 mL/hr over 240 Minutes Intravenous Every 8 hours 09/14/15 1551 09/17/15 0625   09/14/15 1215  piperacillin-tazobactam (ZOSYN) IVPB 3.375 g     3.375  g 100 mL/hr over 30 Minutes Intravenous  Once 09/14/15 1213 09/14/15 1329      Assessment/Plan:   Acute diverticulitis with possible tiny pericolonic abscess. No evidence of peritonitis or drainable abscess. No evidence of obstruction.  Advance to soft diet this morning If she does well discharge later today I have switched her antibiotics to oral Augmentin.  I recommend a 10 day course of oral Augmentin as outpatient Soft diet for 2-3 weeks.  I will transition to high-fiber low-fat diet as outpatient Senokot daily due to chronic constipation Follow-up with me in 3 weeks I will refer her for colonoscopy in 6-8 weeks. She has my card.   DVT prophylaxis-lovenox Sjogren syndrome. Raynauds syndrome Mild obesity Chronic constipation   LOS: 3 days    Clinten Howk M 09/17/2015

## 2015-09-17 NOTE — Discharge Summary (Signed)
Physician Discharge Summary  Amanda Pugh:[id number] DOB: [birth year] DOA: 09/14/2015  PCP: Birdena Jubilee, MD  Admit date: 09/14/2015 Discharge date: 09/17/2015  Admitted From: home  Disposition:  home  Recommendations for Outpatient Follow-up:  1. Follow up with Dr. Derrell Lolling as scheduled 2. Continue Augmentin for 10 more days  Discharge Condition: stable CODE STATUS: Full Diet recommendation: soft  HPI: 45 y.o. female with medical history significant of Sjogren syndrome, Raynaud diseases, on plaquenil who presents to [facility] ED complaining of abdominal pain, cramping in nature that started 2 days prior to admission. CT abdomen with diverticulosis with severe sigmoid diverticulitis, and small abscess 0.7 cm, free fluids in the pelvis. General surgery consulted  Hospital Course: Discharge Diagnoses:  Principal Problem:   Diverticulitis Active Problems:   Sjogren's syndrome (HCC)   Diverticulitis / small abscess - general surgery following, patient was given bowel rest, supportive treatment with IV fluids, IV antibiotics, symptomatically improved. Her diet was slowly advance, able to tolerate a regular diet, tolerating po antibiotics, will continue Augmentin for 10 additional days. She will have follow up with general surgery as an outpatient.  Sjogren, Raynaud - outpatient management Hypokalemia - resolved Hyponatremia - resolved with hydration   Discharge Instructions     Medication List    STOP taking these medications        metroNIDAZOLE 500 MG tablet  Commonly known as:  FLAGYL      TAKE these medications        ALLEGRA ALLERGY 180 MG tablet  Generic drug:  fexofenadine  Take 180 mg by mouth daily as needed for allergies or rhinitis.     amoxicillin-clavulanate 875-125 MG tablet  Commonly known as:  AUGMENTIN  Take 1 tablet by mouth every 12 (twelve) hours.     cholecalciferol 1000 units tablet  Commonly known as:  VITAMIN D  Take  2,000 Units by mouth daily.     ibuprofen 200 MG tablet  Commonly known as:  ADVIL,MOTRIN  Take 200 mg by mouth every 6 (six) hours as needed for headache or moderate pain.     mometasone 50 MCG/ACT nasal spray  Commonly known as:  NASONEX  Place 2 sprays into the nose 2 (two) times daily as needed (allergies).     multivitamin with minerals Tabs tablet  Take 1 tablet by mouth daily.     OVER THE COUNTER MEDICATION  Apply 1 application topically daily as needed (muscle aches). Magnesium oil     PLAQUENIL 200 MG tablet  Generic drug:  hydroxychloroquine  Take 200 mg by mouth 2 (two) times daily.           Follow-up Information    Follow up with [name], MD. Schedule an appointment as soon as possible for a visit in 3 weeks.   Specialty:  General Surgery   Contact information:   [address] [place] [address] [phone]      Allergies  Allergen Reactions  . Bupropion Palpitations  . Minocycline Shortness Of Breath  . Polyethylene Glycol Anaphylaxis   Consultations:  General surgery   Procedures/Studies:  None   Ct Abdomen Pelvis W Contrast  09/14/2015  CLINICAL DATA:  Left lower quadrant pain for 3 days. EXAM: CT ABDOMEN AND PELVIS WITH CONTRAST TECHNIQUE: Multidetector CT imaging of the abdomen and pelvis was performed using the standard protocol following bolus administration of intravenous contrast. CONTRAST:  100 ml ISOVUE-300 IOPAMIDOL (ISOVUE-300) INJECTION 61% COMPARISON:  None. FINDINGS:  The lung bases are clear.  No pleural or pericardial effusion. The liver, gallbladder, adrenal glands, spleen, pancreas and right kidney appear normal. A nonobstructing 0.7 cm stone is seen in the lower pole of the left kidney. The patient has diverticulosis. Marked wall thickening and extensive inflammatory change are seen about the mid sigmoid colon consistent with acute diverticulitis. There is free fluid in the pelvis. A rim enhancing fluid  collection measuring 0.7 cm in diameter compatible with a tiny abscess is seen in the left pelvis. No free intraperitoneal air is identified. The stomach and small bowel appear normal. The uterus and right ovary appear normal. Small involuting cyst in left ovary is noted. There is no lymphadenopathy. No focal bony abnormality is identified. IMPRESSION: Diverticulosis with superimposed severe appearing sigmoid diverticulitis. A tiny abscess measuring 0.7 cm is identified in the left pelvis. Free fluid in the pelvis is also seen. No drainable collection is identified. 0.7 cm nonobstructing stone lower pole left kidney. Electronically Signed   By: Drusilla Kannerhomas  Dalessio M.D.   On: 09/14/2015 11:30     Subjective: - no chest pain, shortness of breath, no abdominal pain, nausea or vomiting.   Discharge Exam: Filed Vitals:   09/16/15 2127 09/17/15 0606  BP: 122/66 114/61  Pulse: 74 78  Temp: 98.5 F (36.9 C) 98.5 F (36.9 C)  Resp: 16 15   Filed Vitals:   09/16/15 1405 09/16/15 2127 09/17/15 0606 09/17/15 0636  BP: 125/63 122/66 114/61   Pulse: 81 74 78   Temp: 98.4 F (36.9 C) 98.5 F (36.9 C) 98.5 F (36.9 C)   TempSrc: Oral Oral Oral   Resp: 16 16 15    Height:      Weight:    87.4 kg (192 lb 10.9 oz)  SpO2: 99% 100% 100%     General: Pt is alert, awake, not in acute distress Abdominal: Soft, NT, ND   The results of significant diagnostics from this hospitalization (including imaging, microbiology, ancillary and laboratory) are listed below for reference.     Microbiology: Recent Results (from the past 240 hour(s))  Urine culture     Status: Abnormal   Collection Time: 09/14/15  8:56 AM  Result Value Ref Range Status   Specimen Description URINE, CLEAN CATCH  Final   Special Requests NONE  Final   Culture MULTIPLE SPECIES PRESENT, SUGGEST RECOLLECTION (A)  Final   Report Status 09/15/2015 FINAL  Final  Blood culture (routine x 2)     Status: None (Preliminary result)    Collection Time: 09/14/15 12:20 PM  Result Value Ref Range Status   Specimen Description [facility]LOOD [facility]  Final   Special Requests BOTTLES DRAWN AEROBIC AND ANAEROBIC 5CC  Final   Culture   Final    NO GROWTH 2 DAYS [facility]    Report Status PENDING  Incomplete  Blood culture (routine x 2)     Status: None (Preliminary result)   Collection Time: 09/14/15 12:25 PM  Result Value Ref Range Status   Specimen Description BLOOD LEFT ANTECUBITAL  Final   Special Requests BOTTLES DRAWN AEROBIC AND ANAEROBIC 5CC  Final   Culture   Final    NO GROWTH 2 DAYS [facility]     Report Status PENDING  Incomplete     Labs: BNP (last 3 results) No results for input(s): BNP in the last 8760 hours. Basic Metabolic Panel:  Recent Labs Lab 09/14/15 0925 09/15/15 0434 09/16/15 0425  NA  134* 136 137  K 4.0 3.3* 3.9  CL 101 105 106  CO2 25 25 26   GLUCOSE 130* 136* 101*  BUN 7 5* <5*  CREATININE 0.82 0.62 0.66  CALCIUM 8.9 7.8* 8.4*   Liver Function Tests:  Recent Labs Lab 09/14/15 0925 09/15/15 0434  AST 19 15  ALT 19 14  ALKPHOS 45 37*  BILITOT 0.9 0.9  PROT 7.5 6.4*  ALBUMIN 4.0 3.2*    Recent Labs Lab 09/14/15 0925  LIPASE 18   No results for input(s): AMMONIA in the last 168 hours. CBC:  Recent Labs Lab 09/14/15 0925 09/15/15 0434  WBC 13.6* 8.7  NEUTROABS 10.7*  --   HGB 13.3 11.2*  HCT 39.1 33.8*  MCV 90.5 91.6  PLT 237 207   Cardiac Enzymes: No results for input(s): CKTOTAL, CKMB, CKMBINDEX, TROPONINI in the last 168 hours. BNP: Invalid input(s): POCBNP CBG: No results for input(s): GLUCAP in the last 168 hours. D-Dimer No results for input(s): DDIMER in the last 72 hours. Hgb A1c No results for input(s): HGBA1C in the last 72 hours. Lipid Profile No results for input(s): CHOL, HDL, LDLCALC, TRIG, CHOLHDL, LDLDIRECT in the last 72 hours. Thyroid function studies No results for input(s): TSH, T4TOTAL, T3FREE,  THYROIDAB in the last 72 hours.  Invalid input(s): FREET3 Anemia work up No results for input(s): VITAMINB12, FOLATE, FERRITIN, TIBC, IRON, RETICCTPCT in the last 72 hours. Urinalysis    Component Value Date/Time   COLORURINE YELLOW 09/14/2015 0856   APPEARANCEUR CLEAR 09/14/2015 0856   LABSPEC 1.005 09/14/2015 0856   PHURINE 7.0 09/14/2015 0856   GLUCOSEU NEGATIVE 09/14/2015 0856   HGBUR NEGATIVE 09/14/2015 0856   BILIRUBINUR NEGATIVE 09/14/2015 0856   KETONESUR NEGATIVE 09/14/2015 0856   PROTEINUR NEGATIVE 09/14/2015 0856   NITRITE NEGATIVE 09/14/2015 0856   LEUKOCYTESUR NEGATIVE 09/14/2015 0856   Sepsis Labs Invalid input(s): PROCALCITONIN,  WBC,  LACTICIDVEN Microbiology Recent Results (from the past 240 hour(s))  Urine culture     Status: Abnormal   Collection Time: 09/14/15  8:56 AM  Result Value Ref Range Status   Specimen Description URINE, CLEAN CATCH  Final   Special Requests NONE  Final   Culture MULTIPLE SPECIES PRESENT, SUGGEST RECOLLECTION (A)  Final   Report Status 09/15/2015 FINAL  Final  Blood culture (routine x 2)     Status: None (Preliminary result)   Collection Time: 09/14/15 12:20 PM  Result Value Ref Range Status   Specimen Description BLOOD RT AC  Final   Special Requests BOTTLES DRAWN AEROBIC AND ANAEROBIC 5CC  Final   Culture   Final    NO GROWTH 2 DAYS Performed at [facility]    Report Status PENDING  Incomplete  Blood culture (routine x 2)     Status: None (Preliminary result)   Collection Time: 09/14/15 12:25 PM  Result Value Ref Range Status   Specimen Description BLOOD LEFT ANTECUBITAL  Final   Special Requests BOTTLES DRAWN AEROBIC AND ANAEROBIC 5CC  Final   Culture   Final    NO GROWTH 2 DAYS Performed at [facility]    Report Status PENDING  Incomplete    Time coordinating discharge: < 30 minutes  SIGNED:  Pamella Pert, MD  [facility] 09/17/2015, 11:26 AM Pager [phone]  If 7PM-7AM, please  contact night-coverage [email or web address] Password [email or web address]

## 2015-09-17 NOTE — Discharge Instructions (Signed)
Diverticulitis Diverticulitis is when small pockets that have formed in your colon (large intestine) become infected or swollen. HOME CARE  Follow your doctor's instructions.  Follow a special diet if told by your doctor.  When you feel better, your doctor may tell you to change your diet. You may be told to eat a lot of fiber. Fruits and vegetables are good sources of fiber. Fiber makes it easier to poop (have bowel movements).  Take supplements or probiotics as told by your doctor.  Only take medicines as told by your doctor.  Keep all follow-up visits with your doctor. GET HELP IF:  Your pain does not get better.  You have a hard time eating food.  You are not pooping like normal. GET HELP RIGHT AWAY IF:  Your pain gets worse.  Your problems do not get better.  Your problems suddenly get worse.  You have a fever.  You keep throwing up (vomiting).  You have bloody or black, tarry poop (stool). MAKE SURE YOU:   Understand these instructions. Diverticulitis Diverticulitis is inflammation or infection of small pouches in your colon that form when you have a condition called diverticulosis. The pouches in your colon are called diverticula. Your colon, or large intestine, is where water is absorbed and stool is formed. Complications of diverticulitis can include: Bleeding. Severe infection. Severe pain. Perforation of your colon. Obstruction of your colon. CAUSES  Diverticulitis is caused by bacteria. Diverticulitis happens when stool becomes trapped in diverticula. This allows bacteria to grow in the diverticula, which can lead to inflammation and infection. RISK FACTORS People with diverticulosis are at risk for diverticulitis. Eating a diet that does not include enough fiber from fruits and vegetables may make diverticulitis more likely to develop. SYMPTOMS  Symptoms of diverticulitis may include: Abdominal pain and tenderness. The pain is normally located on the  left side of the abdomen, but may occur in other areas. Fever and chills. Bloating. Cramping. Nausea. Vomiting. Constipation. Diarrhea. Blood in your stool. DIAGNOSIS  Your health care provider will ask you about your medical history and do a physical exam. You may need to have tests done because many medical conditions can cause the same symptoms as diverticulitis. Tests may include: Blood tests. Urine tests. Imaging tests of the abdomen, including X-rays and CT scans. When your condition is under control, your health care provider may recommend that you have a colonoscopy. A colonoscopy can show how severe your diverticula are and whether something else is causing your symptoms. TREATMENT  Most cases of diverticulitis are mild and can be treated at home. Treatment may include: Taking over-the-counter pain medicines. Following a clear liquid diet. Taking antibiotic medicines by mouth for 7-10 days. More severe cases may be treated at a hospital. Treatment may include: Not eating or drinking. Taking prescription pain medicine. Receiving antibiotic medicines through an IV tube. Receiving fluids and nutrition through an IV tube. Surgery. HOME CARE INSTRUCTIONS  Follow your health care provider's instructions carefully. Follow a full liquid diet or other diet as directed by your health care provider. After your symptoms improve, your health care provider may tell you to change your diet. He or she may recommend you eat a high-fiber diet. Fruits and vegetables are good sources of fiber. Fiber makes it easier to pass stool. Take fiber supplements or probiotics as directed by your health care provider. Only take medicines as directed by your health care provider. Keep all your follow-up appointments. SEEK MEDICAL CARE IF:  Your pain  does not improve. You have a hard time eating food. Your bowel movements do not return to normal. SEEK IMMEDIATE MEDICAL CARE IF:  Your pain becomes  worse. Your symptoms do not get better. Your symptoms suddenly get worse. You have a fever. You have repeated vomiting. You have bloody or black, tarry stools. MAKE SURE YOU:  Understand these instructions. Will watch your condition. Will get help right away if you are not doing well or get worse.   This information is not intended to replace advice given to you by your health care provider. Make sure you discuss any questions you have with your health care provider.   Document Released: 11/30/2004 Document Revised: 02/25/2013 Document Reviewed: 01/15/2013 Elsevier Interactive Patient Education [employer]   Will watch your condition.  Will get help right away if you are not doing well or get worse.   This information is not intended to replace advice given to you by your health care provider. Make sure you discuss any questions you have with your health care provider.   Document Released: 08/09/2007 Document Revised: 02/25/2013 Document Reviewed: 01/15/2013 Elsevier Interactive Patient Education [employer]

## 2015-09-19 LAB — CULTURE, BLOOD (ROUTINE X 2)
CULTURE: NO GROWTH
Culture: NO GROWTH

## 2015-11-03 ENCOUNTER — Other Ambulatory Visit: Payer: Self-pay | Admitting: Gastroenterology

## 2015-11-03 DIAGNOSIS — Z8719 Personal history of other diseases of the digestive system: Secondary | ICD-10-CM

## 2015-11-15 ENCOUNTER — Other Ambulatory Visit: Payer: Commercial Managed Care - PPO

## 2015-11-18 ENCOUNTER — Ambulatory Visit
Admission: RE | Admit: 2015-11-18 | Discharge: 2015-11-18 | Disposition: A | Discharge status: Home / Self Care | Payer: Commercial Managed Care - PPO | Source: Ambulatory Visit | Attending: Gastroenterology | Admitting: Gastroenterology

## 2015-11-18 DIAGNOSIS — Z8719 Personal history of other diseases of the digestive system: Secondary | ICD-10-CM

## 2015-11-18 MED ORDER — IOPAMIDOL (ISOVUE-300) INJECTION 61%
100.0000 mL | Freq: Once | INTRAVENOUS | Status: AC | PRN
  Administered 2015-11-18: 100 mL via INTRAVENOUS

## 2017-06-28 ENCOUNTER — Other Ambulatory Visit: Payer: Self-pay

## 2017-06-28 ENCOUNTER — Encounter (HOSPITAL_BASED_OUTPATIENT_CLINIC_OR_DEPARTMENT_OTHER): Payer: Self-pay

## 2017-06-28 ENCOUNTER — Emergency Department (HOSPITAL_BASED_OUTPATIENT_CLINIC_OR_DEPARTMENT_OTHER): Payer: Commercial Managed Care - PPO

## 2017-06-28 ENCOUNTER — Emergency Department (HOSPITAL_BASED_OUTPATIENT_CLINIC_OR_DEPARTMENT_OTHER)
Admission: EM | Admit: 2017-06-28 | Discharge: 2017-06-28 | Disposition: A | Discharge status: Home / Self Care | Payer: Commercial Managed Care - PPO | Attending: Emergency Medicine | Admitting: Emergency Medicine

## 2017-06-28 DIAGNOSIS — Z79899 Other long term (current) drug therapy: Secondary | ICD-10-CM | POA: Insufficient documentation

## 2017-06-28 DIAGNOSIS — R109 Unspecified abdominal pain: Secondary | ICD-10-CM | POA: Diagnosis present

## 2017-06-28 DIAGNOSIS — K5732 Diverticulitis of large intestine without perforation or abscess without bleeding: Secondary | ICD-10-CM

## 2017-06-28 HISTORY — DX: Diverticulitis of intestine, part unspecified, without perforation or abscess without bleeding: K57.92

## 2017-06-28 LAB — URINALYSIS, ROUTINE W REFLEX MICROSCOPIC
Bilirubin Urine: NEGATIVE
GLUCOSE, UA: NEGATIVE mg/dL
Ketones, ur: NEGATIVE mg/dL
Nitrite: NEGATIVE
Protein, ur: NEGATIVE mg/dL
pH: 6.5 (ref 5.0–8.0)

## 2017-06-28 LAB — COMPREHENSIVE METABOLIC PANEL
ALK PHOS: 47 U/L (ref 38–126)
ALT: 17 U/L (ref 14–54)
AST: 22 U/L (ref 15–41)
Albumin: 4.2 g/dL (ref 3.5–5.0)
Anion gap: 10 (ref 5–15)
BUN: 7 mg/dL (ref 6–20)
CO2: 26 mmol/L (ref 22–32)
CREATININE: 0.7 mg/dL (ref 0.44–1.00)
Calcium: 8.8 mg/dL — ABNORMAL LOW (ref 8.9–10.3)
Chloride: 100 mmol/L — ABNORMAL LOW (ref 101–111)
GFR calc Af Amer: >60 mL/min (ref >60)
GLUCOSE: 112 mg/dL — AB (ref 65–99)
POTASSIUM: 3.7 mmol/L (ref 3.5–5.1)
Sodium: 136 mmol/L (ref 135–145)
TOTAL PROTEIN: 7.8 g/dL (ref 6.5–8.1)
Total Bilirubin: 1.1 mg/dL (ref 0.3–1.2)

## 2017-06-28 LAB — CBC WITH DIFFERENTIAL/PLATELET
BASOS ABS: 0 10*3/uL (ref 0.0–0.1)
Basophils Relative: 0 %
Eosinophils Absolute: 0.1 10*3/uL (ref 0.0–0.7)
Eosinophils Relative: 0 %
HCT: 36.8 % (ref 36.0–46.0)
HEMOGLOBIN: 12.6 g/dL (ref 12.0–15.0)
LYMPHS PCT: 21 %
Lymphs Abs: 2.7 10*3/uL (ref 0.7–4.0)
MCH: 31.4 pg (ref 26.0–34.0)
MCHC: 34.2 g/dL (ref 30.0–36.0)
MCV: 91.8 fL (ref 78.0–100.0)
MONO ABS: 0.9 10*3/uL (ref 0.1–1.0)
MONOS PCT: 7 %
NEUTROS ABS: 9.1 10*3/uL — AB (ref 1.7–7.7)
NEUTROS PCT: 72 %
Platelets: 263 10*3/uL (ref 150–400)
RBC: 4.01 MIL/uL (ref 3.87–5.11)
RDW: 12.7 % (ref 11.5–15.5)
WBC: 12.8 10*3/uL — ABNORMAL HIGH (ref 4.0–10.5)

## 2017-06-28 LAB — URINALYSIS, MICROSCOPIC (REFLEX)

## 2017-06-28 LAB — PREGNANCY, URINE: Preg Test, Ur: NEGATIVE

## 2017-06-28 MED ORDER — SODIUM CHLORIDE 0.9 % IV BOLUS
500.0000 mL | Freq: Once | INTRAVENOUS | Status: AC
  Administered 2017-06-28: 500 mL via INTRAVENOUS

## 2017-06-28 MED ORDER — METRONIDAZOLE 500 MG PO TABS
500.0000 mg | ORAL_TABLET | Freq: Once | ORAL | Status: AC
  Administered 2017-06-28: 500 mg via ORAL
  Filled 2017-06-28: qty 1

## 2017-06-28 MED ORDER — METRONIDAZOLE 500 MG PO TABS: 500.0000 mg | ORAL_TABLET | Freq: Two times a day (BID) | ORAL | 0 refills | Status: AC

## 2017-06-28 MED ORDER — IOPAMIDOL (ISOVUE-300) INJECTION 61%
100.0000 mL | Freq: Once | INTRAVENOUS | Status: AC | PRN
  Administered 2017-06-28: 100 mL via INTRAVENOUS

## 2017-06-28 MED ORDER — CIPROFLOXACIN HCL 500 MG PO TABS
500.0000 mg | ORAL_TABLET | Freq: Once | ORAL | Status: AC
  Administered 2017-06-28: 500 mg via ORAL
  Filled 2017-06-28: qty 1

## 2017-06-28 MED ORDER — CIPROFLOXACIN HCL 500 MG PO TABS: 500.0000 mg | ORAL_TABLET | Freq: Two times a day (BID) | ORAL | 0 refills | Status: AC

## 2017-06-28 NOTE — ED Notes (Signed)
ED Provider at bedside. 

## 2017-06-28 NOTE — Discharge Instructions (Addendum)
As discussed, your CT scan showed diverticulitis of your colon at the flexure near your liver today. Make sure to take your entire course of antibiotics even if you feel better.  Follow-up with your PCP on Monday. You were given follow-up with gastroenterology as needed. Return sooner if symptoms worsen, blood in your stool, diarrhea, fever, chills or any other new concerning symptoms.

## 2017-06-28 NOTE — ED Triage Notes (Signed)
C/o right side abd pain x 2 days-diarrhea on day 1-NAD-steady gait

## 2017-06-28 NOTE — ED Provider Notes (Signed)
[facility] EMERGENCY DEPARTMENT Provider Note   CSN: [id number] Arrival date & time: 06/28/17  1612     History   Chief Complaint Chief Complaint  Patient presents with  . Abdominal Pain    HPI Amanda Pugh is a 47 y.o. female with past medical history significant for anemia, diverticulitis, GERD, Sjogren syndrome, Raynaud's presenting with 2 days of right lower quadrant pain which initiated in the periumbilical area and started to shift with constant pain in the right lower quadrant now.  Also reports multiple episodes of explosive nonbloody diarrhea 2 days ago.  She has not had any bowel movements yesterday and the only a small bowel movement today.  He also reports fevers and chills and back pain.  No known ill contacts. Denies any nausea vomiting.  She is able to void fully and without urinary symptoms.  She has tried ibuprofen with some relief of her symptoms. She explains that she was treated for a urinary tract infection a month ago and had Macrobid leftover so she took one yesterday and one today. LMP unclear due to irregular cycles.  States that she went about 6 months without any periods and then started to have sporadic bleeding.  She denies any bleeding at this time no vaginal discharge.  Past surgical history includes bilateral salpingectomy.  HPI  Past Medical History:  Diagnosis Date  . Anemia   . Diverticulitis   . Genital herpes   . Genital warts   . GERD (gastroesophageal reflux disease)   . Migraines   . Raynaud's disease   . Sjoegren syndrome     Patient Active Problem List   Diagnosis Date Noted  . Diverticulitis 09/14/2015  . Sjogren's syndrome (HCC) 09/14/2015  . Need for prophylactic vaccination with combined diphtheria-tetanus-pertussis (DTP) vaccine 01/06/2013  . Vaginitis and vulvovaginitis 01/06/2013  . Cat bite 01/06/2013    Past Surgical History:  Procedure Laterality Date  . BILATERAL SALPINGECTOMY       OB History     None      Home Medications    Prior to Admission medications   Medication Sig Start Date End Date Taking? Authorizing Provider  cholecalciferol (VITAMIN D) 1000 units tablet Take 2,000 Units by mouth daily.    [provider]  ciprofloxacin (CIPRO) 500 MG tablet Take 1 tablet (500 mg total) by mouth every 12 (twelve) hours for 10 days. 06/28/17 07/08/17  Georgiana Shore, PA-C  fexofenadine (ALLEGRA ALLERGY) 180 MG tablet Take 180 mg by mouth daily as needed for allergies or rhinitis.    [provider]  ibuprofen (ADVIL,MOTRIN) 200 MG tablet Take 200 mg by mouth every 6 (six) hours as needed for headache or moderate pain.    [provider]  metroNIDAZOLE (FLAGYL) 500 MG tablet Take 1 tablet (500 mg total) by mouth 2 (two) times daily for 10 days. 06/28/17 07/08/17  Mathews Robinsons B, PA-C  mometasone (NASONEX) 50 MCG/ACT nasal spray Place 2 sprays into the nose 2 (two) times daily as needed (allergies).    [provider]  Multiple Vitamin (MULTIVITAMIN WITH MINERALS) TABS tablet Take 1 tablet by mouth daily.    [provider]  OVER THE COUNTER MEDICATION Apply 1 application topically daily as needed (muscle aches). Magnesium oil    [provider]    Family History Family History  Problem Relation Age of Onset  . Hyperlipidemia Mother   . Hypertension Father   . Cancer Paternal Aunt  Breast Cancer  . Diabetes Maternal Grandmother     Social History Social History   Tobacco Use  . Smoking status: Never Smoker  . Smokeless tobacco: Never Used  Substance Use Topics  . Alcohol use: No  . Drug use: No     Allergies   Bupropion; Minocycline; and Polyethylene glycol   Review of Systems Review of Systems  Constitutional: Positive for appetite change, chills and fever.  Respiratory: Negative for cough, choking, chest tightness, shortness of breath, wheezing and stridor.   Cardiovascular: Negative for chest pain,  palpitations and leg swelling.  Gastrointestinal: Positive for abdominal pain and diarrhea. Negative for abdominal distention, blood in stool, nausea and vomiting.  Genitourinary: Positive for flank pain. Negative for decreased urine volume, difficulty urinating, dysuria, frequency, hematuria, pelvic pain, urgency, vaginal bleeding and vaginal discharge.       States that her pain is starting to move to her right flank and RUQ  Musculoskeletal: Negative for gait problem, neck pain and neck stiffness.  Skin: Negative for color change, pallor and rash.  Neurological: Negative for dizziness, weakness, light-headedness and headaches.  Psychiatric/Behavioral: Negative for behavioral problems.     Physical Exam Updated Vital Signs BP 122/62 (BP Location: Left Arm)   Pulse 95   Temp 98.3 F (36.8 C) (Oral)   Resp 18   Ht 5\' 6"  (1.676 m)   Wt 95.8 kg (211 lb 3.2 oz)   SpO2 100%   BMI 34.09 kg/m   Physical Exam  Constitutional: She appears well-developed and well-nourished.  Non-toxic appearance. She does not appear ill. No distress.  Afebrile, nontoxic-appearing, lying comfortably in bed no acute distress.  HENT:  Head: Normocephalic and atraumatic.  Eyes: Conjunctivae are normal. No scleral icterus.  Neck: Neck supple.  Cardiovascular: Normal rate, regular rhythm and normal heart sounds.  No murmur heard. Pulmonary/Chest: Effort normal and breath sounds normal. No stridor. No respiratory distress. She has no wheezes. She has no rhonchi. She has no rales.  Abdominal: Soft. Normal appearance and bowel sounds are normal. She exhibits no distension, no pulsatile midline mass and no mass. There is tenderness in the right upper quadrant, right lower quadrant and periumbilical area. There is tenderness at McBurney's point. There is no rigidity, no rebound, no guarding and no CVA tenderness.  Musculoskeletal: She exhibits no edema.  Neurological: She is alert.  Skin: Skin is warm and dry. No  rash noted. She is not diaphoretic. No cyanosis or erythema. No pallor.  Psychiatric: She has a normal mood and affect.  Nursing note and vitals reviewed.    ED Treatments / Results  Labs (all labs ordered are listed, but only abnormal results are displayed) Labs Reviewed  URINALYSIS, ROUTINE W REFLEX MICROSCOPIC - Abnormal; Notable for the following components:      Result Value   Specific Gravity, Urine <1.005 (*)    Hgb urine dipstick TRACE (*)    Leukocytes, UA SMALL (*)    All other components within normal limits  CBC WITH DIFFERENTIAL/PLATELET - Abnormal; Notable for the following components:   WBC 12.8 (*)    Neutro Abs 9.1 (*)    All other components within normal limits  COMPREHENSIVE METABOLIC PANEL - Abnormal; Notable for the following components:   Chloride 100 (*)    Glucose, Bld 112 (*)    Calcium 8.8 (*)    All other components within normal limits  URINALYSIS, MICROSCOPIC (REFLEX) - Abnormal; Notable for the following components:   Bacteria, UA MANY (*)  All other components within normal limits  URINE CULTURE  PREGNANCY, URINE    EKG None  Radiology Ct Abdomen Pelvis W Contrast  Result Date: 06/28/2017 CLINICAL DATA:  Acute right-sided abdominal pain. EXAM: CT ABDOMEN AND PELVIS WITH CONTRAST TECHNIQUE: Multidetector CT imaging of the abdomen and pelvis was performed using the standard protocol following bolus administration of intravenous contrast. CONTRAST:  ISOVUE-300 IOPAMIDOL (ISOVUE-300) INJECTION 61% COMPARISON:  CT scan of November 18, 2015. FINDINGS: Lower chest: No acute abnormality. Hepatobiliary: No focal liver abnormality is seen. No gallstones, gallbladder wall thickening, or biliary dilatation. Pancreas: Unremarkable. No pancreatic ductal dilatation or surrounding inflammatory changes. Spleen: Normal in size without focal abnormality. Adrenals/Urinary Tract: Adrenal glands are unremarkable. Kidneys are normal, without renal calculi,  focal lesion, or hydronephrosis. Bladder is unremarkable. Stomach/Bowel: The stomach appears normal. There is no evidence of bowel obstruction. Diverticulitis of the hepatic flexure is noted. Vascular/Lymphatic: No significant vascular findings are present. No enlarged abdominal or pelvic lymph nodes. Reproductive: Uterus and bilateral adnexa are unremarkable. Other: No abdominal wall hernia or abnormality. No abdominopelvic ascites. Musculoskeletal: No acute or significant osseous findings. IMPRESSION: Focal diverticulitis of the hepatic flexure. No abscess or perforation is noted. Electronically Signed   By: Lupita Raider, M.D.   On: 06/28/2017 19:26    Procedures Procedures (including critical care time)  Medications Ordered in ED Medications  sodium chloride 0.9 % bolus 500 mL (0 mLs Intravenous Stopped 06/28/17 1755)  iopamidol (ISOVUE-300) 61 % injection 100 mL (100 mLs Intravenous Contrast Given 06/28/17 1900)  metroNIDAZOLE (FLAGYL) tablet 500 mg (500 mg Oral Given 06/28/17 2027)  ciprofloxacin (CIPRO) tablet 500 mg (500 mg Oral Given 06/28/17 2027)     Initial Impression / Assessment and Plan / ED Course  I have reviewed the triage vital signs and the nursing notes.  Pertinent labs & imaging results that were available during my care of the patient were reviewed by me and considered in my medical decision making (see chart for details).     Presenting with right lower quadrant pain which initiated in the periumbilical area with associated diarrhea, fever, chills.  On exam she is tender to McBurney's point and around the umbilicus.  No peritoneal signs.  Patient's presentation concerning for appendicitis.  Differential also includes diverticulitis given patient's known history, cholecystitis (although this is less likely given patient's history and physical exam findings), as well as viral gastroenteritis with diarrhea and fever.   Ordered CT abdomen pelvis to evaluate for appendicitis  or diverticulitis  Afebrile in the emergency department but reports subjective fevers and chills at home. Patient declines anything for pain at this time.  Labs with leukocytosis   CT abdomen and pelvis: Showing evidence of diverticulitis of the colon at the hepatic flexure without evidence of abscess or perforation.  No other acute abnormalities.  Patient is stable and otherwise well and given CT scan findings of uncomplicated diverticulitis, she will be a good candidate for outpatient therapy.  Patient will be discharged home with Cipro and Flagyl with close follow-up with PCP and possibly GI as needed.  Discussed strict return precautions advised to return for any worsening or new concerning symptoms. Patient understands and agrees with discharge plan. Final Clinical Impressions(s) / ED Diagnoses   Final diagnoses:  Diverticulitis of large intestine without perforation or abscess without bleeding    ED Discharge Orders        Ordered    ciprofloxacin (CIPRO) 500 MG tablet  Every 12 hours  06/28/17 2024    metroNIDAZOLE (FLAGYL) 500 MG tablet  2 times daily     06/28/17 2024       Gregary CromerMitchell, Amer Alcindor B, PA-C 06/29/17 0221    Melene PlanFloyd, Dan, DO 07/03/17 1205

## 2017-06-28 NOTE — ED Notes (Signed)
Patient transported to CT 

## 2017-06-29 LAB — URINE CULTURE

## 2018-05-20 IMAGING — CT CT ABD-PELV W/ CM
2 of 5 series · 16 of 46 positions shown, 18 images · IV contrast (APPLIED)
Comparison: CT scan of November 18, 2015.

CLINICAL DATA: Acute right-sided abdominal pain.

EXAM:
CT ABDOMEN AND PELVIS WITH CONTRAST
TECHNIQUE: Multidetector CT imaging of the abdomen and pelvis was performed
using the standard protocol following bolus administration of
intravenous contrast.
CONTRAST:  100mL IWGHY3-LDD IOPAMIDOL (IWGHY3-LDD) INJECTION 61%

[Series 2: axial st · axial · 0.84mm/px · z∈[-578,-133]mm · 13 of 101 slices shown, 15 images]
[im 6/101  soft-tissue]
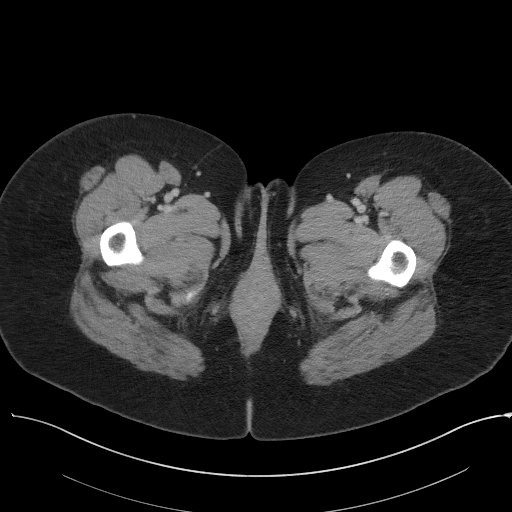
[im 6/101  bone]
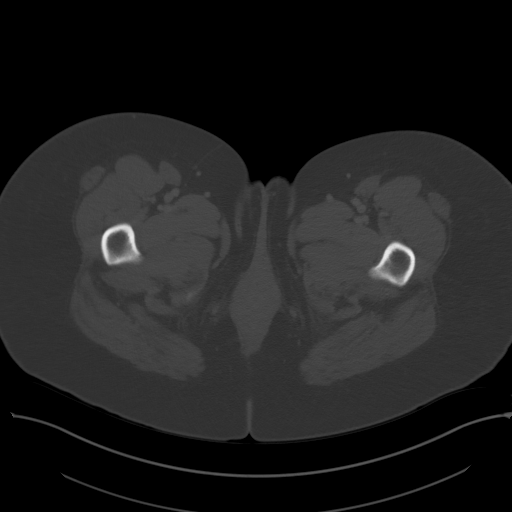
[im 12/101  soft-tissue]
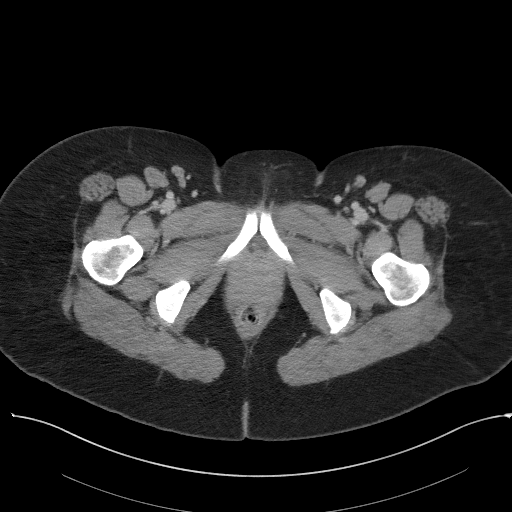
[im 24/101  soft-tissue]
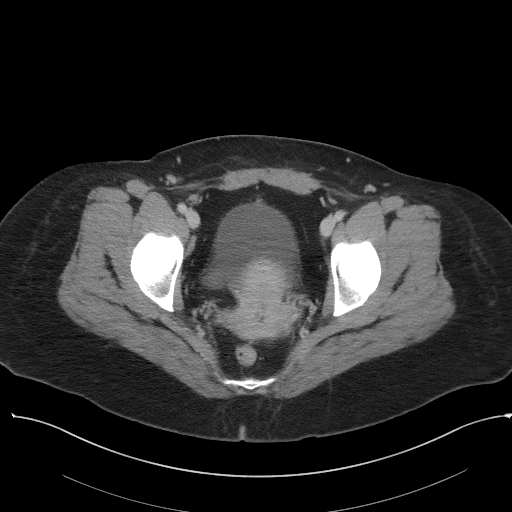
[im 30/101  soft-tissue]
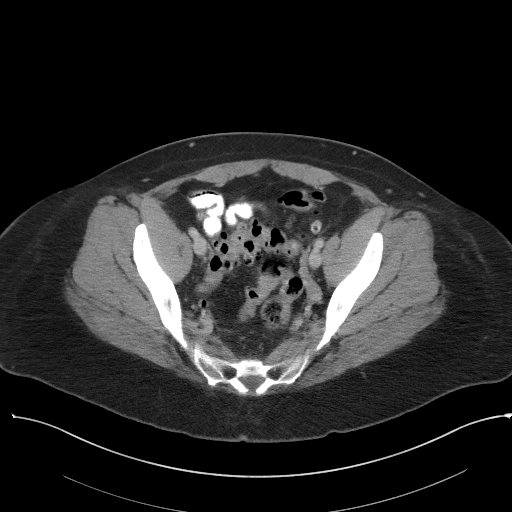
[im 36/101  soft-tissue]
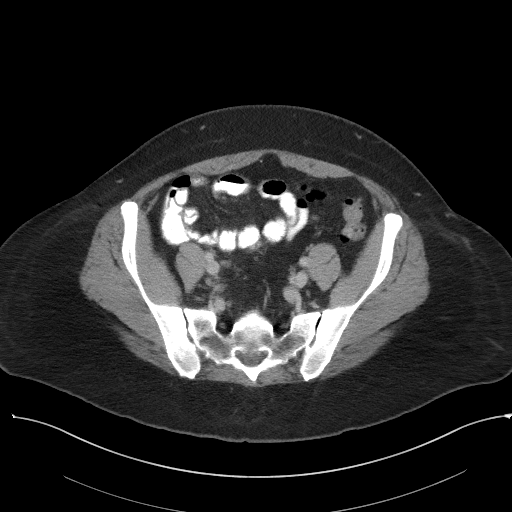
[im 42/101  soft-tissue]
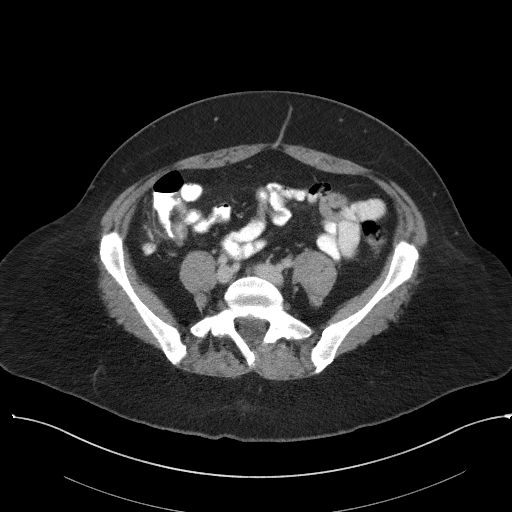
[im 53/101  soft-tissue]
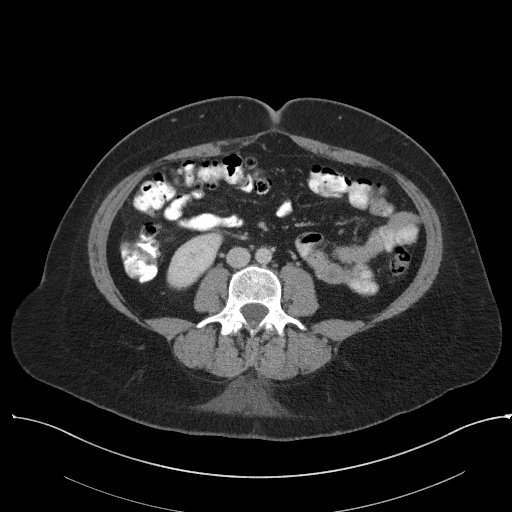
[im 59/101  soft-tissue]
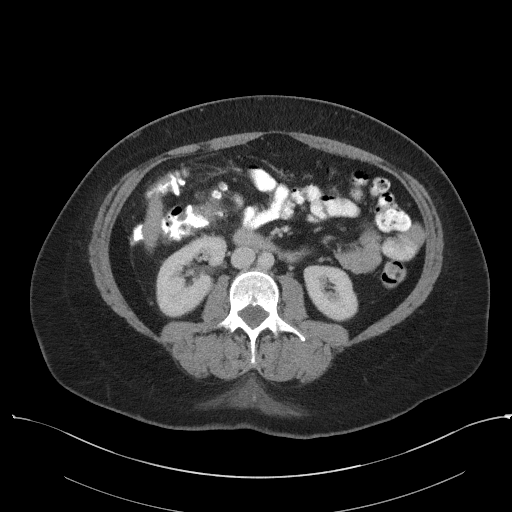
[im 65/101  soft-tissue]
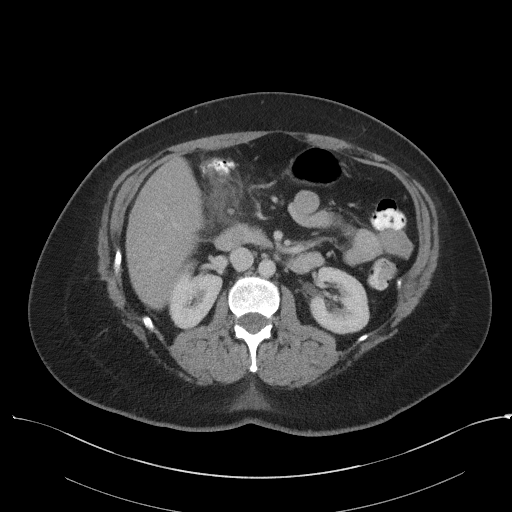
[im 65/101  bone]
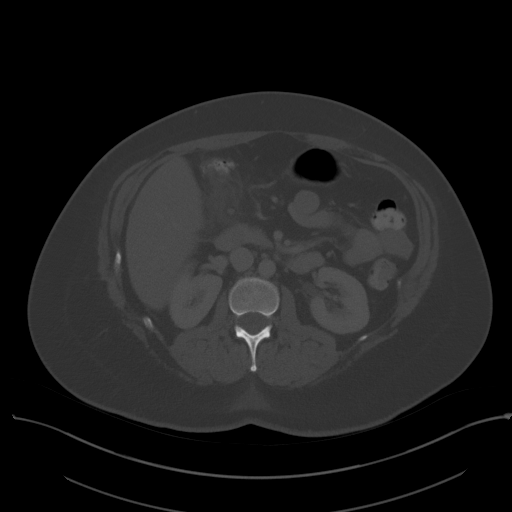
[im 71/101  soft-tissue]
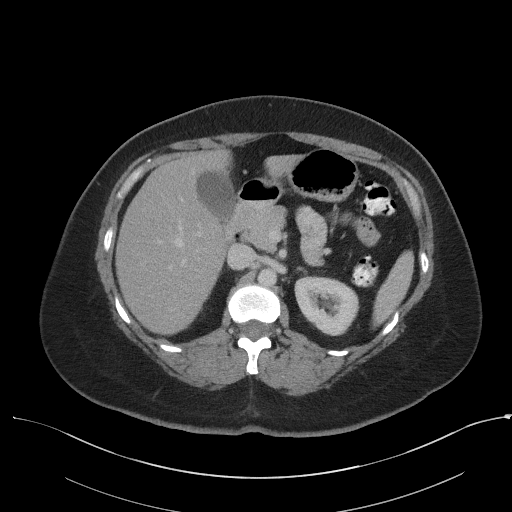
[im 77/101  soft-tissue]
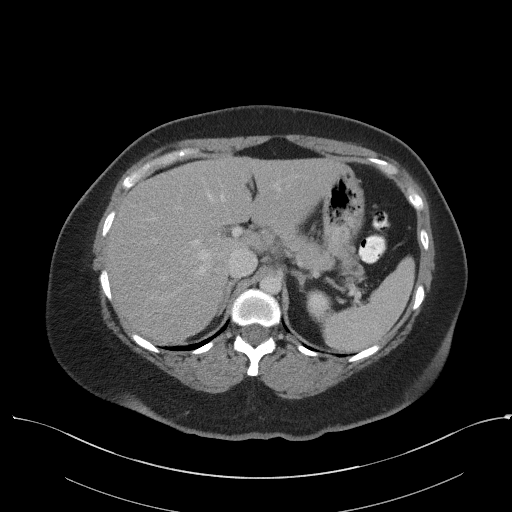
[im 89/101  soft-tissue]
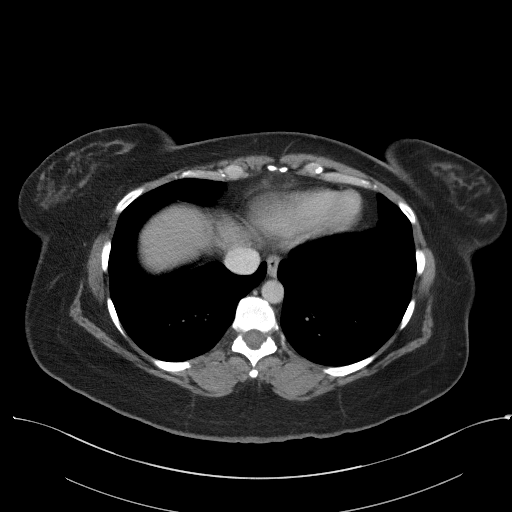
[im 95/101  soft-tissue]
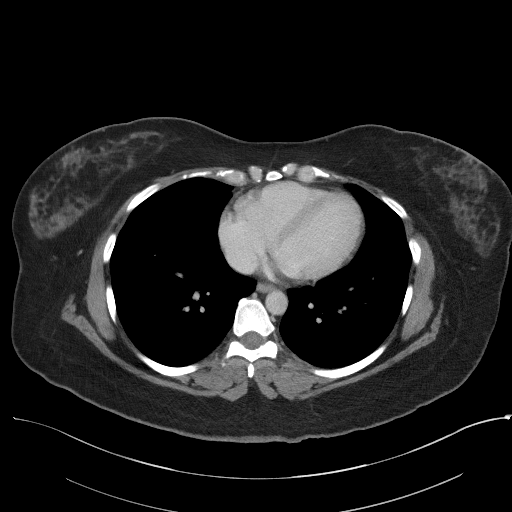

[Series 5: coronal st · coronal · 0.86mm/px · 3 of 101 slices shown]
[im 34/101  soft-tissue]
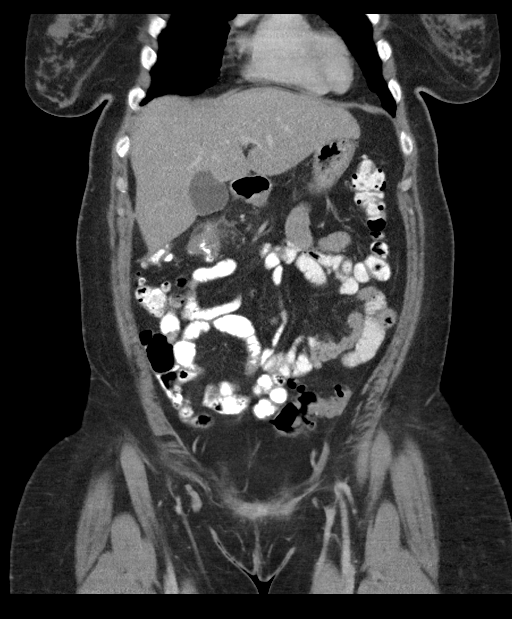
[im 45/101  soft-tissue]
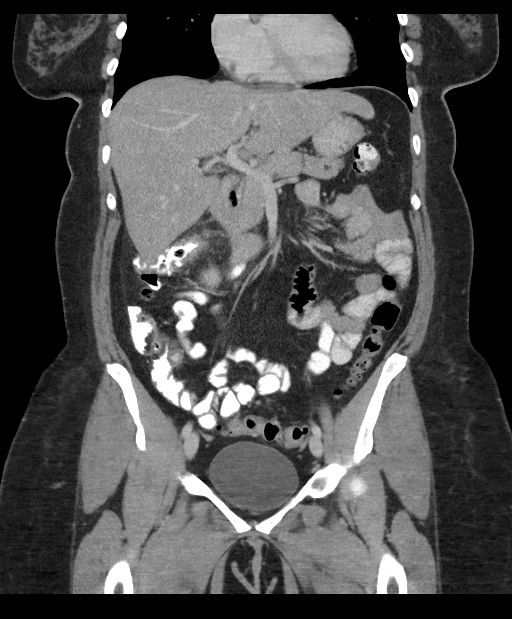
[im 56/101  soft-tissue]
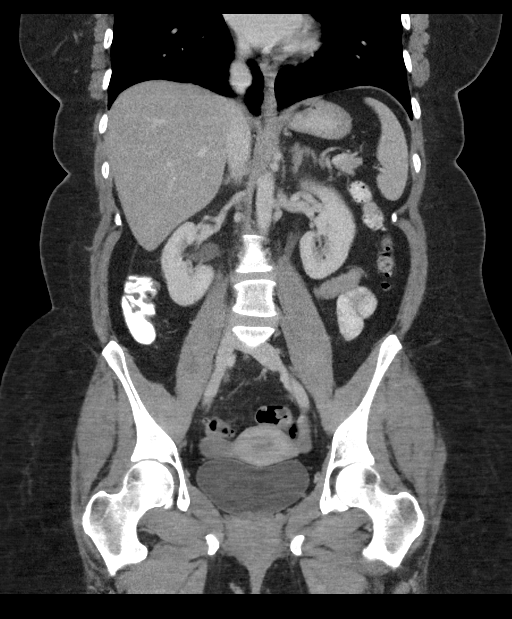

[16 of 46 positions shown; findings below may reference images not displayed]

FINDINGS: Lower chest: No acute abnormality.

Hepatobiliary: No focal liver abnormality is seen. No gallstones,
gallbladder wall thickening, or biliary dilatation.

Pancreas: Unremarkable. No pancreatic ductal dilatation or
surrounding inflammatory changes.

Spleen: Normal in size without focal abnormality.

Adrenals/Urinary Tract: Adrenal glands are unremarkable. Kidneys are
normal, without renal calculi, focal lesion, or hydronephrosis.
Bladder is unremarkable.

Stomach/Bowel: The stomach appears normal. There is no evidence of
bowel obstruction. Diverticulitis of the hepatic flexure is noted.

Vascular/Lymphatic: No significant vascular findings are present. No
enlarged abdominal or pelvic lymph nodes.

Reproductive: Uterus and bilateral adnexa are unremarkable.

Other: No abdominal wall hernia or abnormality. No abdominopelvic
ascites.

Musculoskeletal: No acute or significant osseous findings.
IMPRESSION: Focal diverticulitis of the hepatic flexure. No abscess or
perforation is noted.

## 2019-09-01 ENCOUNTER — Other Ambulatory Visit: Payer: Self-pay

## 2019-09-01 ENCOUNTER — Encounter (HOSPITAL_BASED_OUTPATIENT_CLINIC_OR_DEPARTMENT_OTHER): Payer: Self-pay | Admitting: Emergency Medicine

## 2019-09-01 DIAGNOSIS — K8050 Calculus of bile duct without cholangitis or cholecystitis without obstruction: Secondary | ICD-10-CM | POA: Diagnosis not present

## 2019-09-01 DIAGNOSIS — K219 Gastro-esophageal reflux disease without esophagitis: Secondary | ICD-10-CM | POA: Diagnosis not present

## 2019-09-01 DIAGNOSIS — Z3202 Encounter for pregnancy test, result negative: Secondary | ICD-10-CM | POA: Insufficient documentation

## 2019-09-01 DIAGNOSIS — R1013 Epigastric pain: Secondary | ICD-10-CM | POA: Diagnosis present

## 2019-09-01 NOTE — ED Triage Notes (Signed)
Patient states took meloxicam, hydroxyzine pta with no relief.

## 2019-09-01 NOTE — ED Triage Notes (Signed)
Patient presents with complaints of right upper quad pain radiating to mid back with nausea; states onset 2000 this evening.

## 2019-09-02 ENCOUNTER — Emergency Department (HOSPITAL_BASED_OUTPATIENT_CLINIC_OR_DEPARTMENT_OTHER)
Admission: EM | Admit: 2019-09-02 | Discharge: 2019-09-02 | Disposition: A | Discharge status: Home / Self Care | Payer: BC Managed Care – PPO | Attending: Emergency Medicine | Admitting: Emergency Medicine

## 2019-09-02 ENCOUNTER — Encounter (HOSPITAL_BASED_OUTPATIENT_CLINIC_OR_DEPARTMENT_OTHER): Payer: Self-pay

## 2019-09-02 ENCOUNTER — Emergency Department (HOSPITAL_BASED_OUTPATIENT_CLINIC_OR_DEPARTMENT_OTHER): Payer: BC Managed Care – PPO

## 2019-09-02 DIAGNOSIS — K8050 Calculus of bile duct without cholangitis or cholecystitis without obstruction: Secondary | ICD-10-CM

## 2019-09-02 LAB — COMPREHENSIVE METABOLIC PANEL
ALT: 89 U/L — ABNORMAL HIGH (ref 0–44)
AST: 174 U/L — ABNORMAL HIGH (ref 15–41)
Albumin: 4.4 g/dL (ref 3.5–5.0)
Alkaline Phosphatase: 64 U/L (ref 38–126)
Anion gap: 11 (ref 5–15)
BUN: 16 mg/dL (ref 6–20)
CO2: 26 mmol/L (ref 22–32)
Calcium: 8.9 mg/dL (ref 8.9–10.3)
Chloride: 100 mmol/L (ref 98–111)
Creatinine, Ser: 0.78 mg/dL (ref 0.44–1.00)
GFR calc Af Amer: >60 mL/min (ref >60)
GFR calc non Af Amer: >60 mL/min (ref >60)
Glucose, Bld: 160 mg/dL — ABNORMAL HIGH (ref 70–99)
Potassium: 4 mmol/L (ref 3.5–5.1)
Sodium: 137 mmol/L (ref 135–145)
Total Bilirubin: 1.7 mg/dL — ABNORMAL HIGH (ref 0.3–1.2)
Total Protein: 7.7 g/dL (ref 6.5–8.1)

## 2019-09-02 LAB — URINALYSIS, ROUTINE W REFLEX MICROSCOPIC
Glucose, UA: NEGATIVE mg/dL
Hgb urine dipstick: NEGATIVE
Ketones, ur: NEGATIVE mg/dL
Leukocytes,Ua: NEGATIVE
Nitrite: NEGATIVE
Protein, ur: NEGATIVE mg/dL
Specific Gravity, Urine: >1.03 — ABNORMAL HIGH (ref 1.005–1.030)
pH: 6.5 (ref 5.0–8.0)

## 2019-09-02 LAB — CBC WITH DIFFERENTIAL/PLATELET
Abs Immature Granulocytes: 0.06 10*3/uL (ref 0.00–0.07)
Basophils Absolute: 0.1 10*3/uL (ref 0.0–0.1)
Basophils Relative: 0 %
Eosinophils Absolute: 0 10*3/uL (ref 0.0–0.5)
Eosinophils Relative: 0 %
HCT: 39.5 % (ref 36.0–46.0)
Hemoglobin: 13 g/dL (ref 12.0–15.0)
Immature Granulocytes: 0 %
Lymphocytes Relative: 13 %
Lymphs Abs: 1.8 10*3/uL (ref 0.7–4.0)
MCH: 30.8 pg (ref 26.0–34.0)
MCHC: 32.9 g/dL (ref 30.0–36.0)
MCV: 93.6 fL (ref 80.0–100.0)
Monocytes Absolute: 0.7 10*3/uL (ref 0.1–1.0)
Monocytes Relative: 5 %
Neutro Abs: 10.9 10*3/uL — ABNORMAL HIGH (ref 1.7–7.7)
Neutrophils Relative %: 82 %
Platelets: 242 10*3/uL (ref 150–400)
RBC: 4.22 MIL/uL (ref 3.87–5.11)
RDW: 12.2 % (ref 11.5–15.5)
WBC: 13.4 10*3/uL — ABNORMAL HIGH (ref 4.0–10.5)
nRBC: 0 % (ref 0.0–0.2)

## 2019-09-02 LAB — PREGNANCY, URINE: Preg Test, Ur: NEGATIVE

## 2019-09-02 LAB — LIPASE, BLOOD: Lipase: 37 U/L (ref 11–51)

## 2019-09-02 MED ORDER — ALUM & MAG HYDROXIDE-SIMETH 200-200-20 MG/5ML PO SUSP
30.0000 mL | Freq: Once | ORAL | Status: AC
  Administered 2019-09-02: 30 mL via ORAL
  Filled 2019-09-02: qty 30

## 2019-09-02 MED ORDER — OMEPRAZOLE 20 MG PO CPDR
20.0000 mg | DELAYED_RELEASE_CAPSULE | Freq: Every day | ORAL | 0 refills | Status: AC
Start: 2019-09-02 — End: ?

## 2019-09-02 MED ORDER — LIDOCAINE VISCOUS HCL 2 % MT SOLN
15.0000 mL | Freq: Once | OROMUCOSAL | Status: AC
  Administered 2019-09-02: 15 mL via ORAL
  Filled 2019-09-02: qty 15

## 2019-09-02 MED ORDER — DICYCLOMINE HCL 20 MG PO TABS: 20.0000 mg | ORAL_TABLET | Freq: Two times a day (BID) | ORAL | 0 refills | Status: AC

## 2019-09-02 MED ORDER — KETOROLAC TROMETHAMINE 30 MG/ML IJ SOLN
30.0000 mg | Freq: Once | INTRAMUSCULAR | Status: AC
  Administered 2019-09-02: 30 mg via INTRAVENOUS
  Filled 2019-09-02: qty 1

## 2019-09-02 MED ORDER — IOHEXOL 300 MG/ML  SOLN
100.0000 mL | Freq: Once | INTRAMUSCULAR | Status: AC | PRN
  Administered 2019-09-02: 100 mL via INTRAVENOUS

## 2019-09-02 MED ORDER — DICYCLOMINE HCL 10 MG PO CAPS
10.0000 mg | ORAL_CAPSULE | Freq: Once | ORAL | Status: AC
  Administered 2019-09-02: 10 mg via ORAL
  Filled 2019-09-02: qty 1

## 2019-09-02 NOTE — ED Provider Notes (Signed)
[facility] EMERGENCY DEPARTMENT Provider Note   CSN: [id number] Arrival date & time: 09/01/19  2319     History Chief Complaint  Patient presents with   Abdominal Pain    Amanda Pugh is a 49 y.o. female.  The history is provided by the patient.  Abdominal Pain Pain location:  Epigastric Pain quality: sharp   Pain quality: not gnawing   Pain radiation: to back  Pain severity:  Severe Onset quality:  Sudden Timing:  Constant Progression:  Improving Chronicity:  Recurrent Context: eating   Context comment:  Hot dogs and macaroni salad  Relieved by:  Nothing Worsened by:  Nothing Ineffective treatments:  None tried Associated symptoms: no anorexia, no chest pain, no chills, no constipation, no cough, no diarrhea, no dysuria, no fever, no flatus, no melena, no nausea, no shortness of breath and no vomiting   Risk factors: no alcohol abuse        Past Medical History:  Diagnosis Date   Anemia    Diverticulitis    Genital herpes    Genital warts    GERD (gastroesophageal reflux disease)    Migraines    Raynaud's disease    Sjoegren syndrome     Patient Active Problem List   Diagnosis Date Noted   Diverticulitis 09/14/2015   Sjogren's syndrome (HCC) 09/14/2015   Need for prophylactic vaccination with combined diphtheria-tetanus-pertussis (DTP) vaccine 01/06/2013   Vaginitis and vulvovaginitis 01/06/2013   Cat bite 01/06/2013    Past Surgical History:  Procedure Laterality Date   BILATERAL SALPINGECTOMY       OB History   No obstetric history on file.     Family History  Problem Relation Age of Onset   Hyperlipidemia Mother    Hypertension Father    Cancer Paternal Aunt        Breast Cancer   Diabetes Maternal Grandmother     Social History   Tobacco Use   Smoking status: Never Smoker   Smokeless tobacco: Never Used  Substance Use Topics   Alcohol use: No   Drug use: No    Home Medications Prior  to Admission medications   Medication Sig Start Date End Date Taking? Authorizing Provider  cevimeline (EVOXAC) 30 MG capsule Take 1 capsule by mouth 3 (three) times daily. 05/26/19  Yes [provider]  hydroxychloroquine (PLAQUENIL) 200 MG tablet Take 1 tablet by mouth 2 (two) times daily. 03/13/16 02/10/20 Yes [provider]  hydrOXYzine (VISTARIL) 25 MG capsule Take by mouth. 05/16/16  Yes [provider]  meloxicam (MOBIC) 15 MG tablet Take 1 tablet by mouth daily. 05/27/19  Yes [provider]  cholecalciferol (VITAMIN D) 1000 units tablet Take 2,000 Units by mouth daily.    [provider]  Cholecalciferol 50 MCG (2000 UT) TABS Take by mouth.    [provider]  dicyclomine (BENTYL) 20 MG tablet Take 1 tablet (20 mg total) by mouth 2 (two) times daily. 09/02/19   Decie Verne, MD  fexofenadine [facility] ALLERGY) 180 MG tablet Take 180 mg by mouth daily as needed for allergies or rhinitis.    [provider]  fexofenadine (ALLEGRA) 180 MG tablet 60 mg.    [provider]  ibuprofen (ADVIL,MOTRIN) 200 MG tablet Take 200 mg by mouth every 6 (six) hours as needed for headache or moderate pain.    [provider]  mometasone (NASONEX) 50 MCG/ACT nasal spray Place 2 sprays into the nose 2 (two) times daily as  needed (allergies).    [provider]  Multiple Vitamin (MULTIVITAMIN WITH MINERALS) TABS tablet Take 1 tablet by mouth daily.    [provider]  omeprazole (PRILOSEC) 20 MG capsule Take 1 capsule (20 mg total) by mouth daily. 09/02/19   Dinorah Masullo, MD  OVER THE COUNTER MEDICATION Apply 1 application topically daily as needed (muscle aches). Magnesium oil    [provider]    Allergies    Bupropion, Minocycline, and Polyethylene glycol  Review of Systems   Review of Systems  Constitutional: Negative for chills and fever.  HENT: Negative for congestion.   Eyes: Negative for  visual disturbance.  Respiratory: Negative for cough and shortness of breath.   Cardiovascular: Negative for chest pain.  Gastrointestinal: Positive for abdominal pain. Negative for anorexia, constipation, diarrhea, flatus, melena, nausea and vomiting.  Genitourinary: Negative for dysuria.  Musculoskeletal: Negative for arthralgias.  Skin: Negative for color change.  Neurological: Negative for dizziness.  Psychiatric/Behavioral: Negative for agitation.  All other systems reviewed and are negative.   Physical Exam Updated Vital Signs BP 126/61 (BP Location: Left Arm)    Pulse 81    Temp 98.3 F (36.8 C) (Oral)    Resp 18    Ht 5\' 6"  (1.676 m)    Wt 95 kg    LMP 08/11/2019    SpO2 100%    BMI 33.80 kg/m   Physical Exam Vitals and nursing note reviewed.  Constitutional:      General: She is not in acute distress.    Appearance: Normal appearance.  HENT:     Head: Normocephalic and atraumatic.     Nose: Nose normal.  Eyes:     Conjunctiva/sclera: Conjunctivae normal.     Pupils: Pupils are equal, round, and reactive to light.  Cardiovascular:     Rate and Rhythm: Normal rate and regular rhythm.     Pulses: Normal pulses.     Heart sounds: Normal heart sounds.  Pulmonary:     Effort: Pulmonary effort is normal.     Breath sounds: Normal breath sounds.  Abdominal:     General: Abdomen is flat. Bowel sounds are normal.     Tenderness: There is no abdominal tenderness. There is no guarding or rebound.  Musculoskeletal:        General: Normal range of motion.     Cervical back: Normal range of motion and neck supple.  Skin:    General: Skin is warm and dry.     Capillary Refill: Capillary refill takes less than 2 seconds.  Neurological:     General: No focal deficit present.     Mental Status: She is alert and oriented to person, place, and time.  Psychiatric:        Mood and Affect: Mood normal.        Behavior: Behavior normal.     ED Results / Procedures / Treatments     Labs (all labs ordered are listed, but only abnormal results are displayed) Results for orders placed or performed during the hospital encounter of 09/02/19  CBC with Differential/Platelet  Result Value Ref Range   WBC 13.4 (H) 4.0 - 10.5 K/uL   RBC 4.22 3.87 - 5.11 MIL/uL   Hemoglobin 13.0 12.0 - 15.0 g/dL   HCT 09/04/19 36 - 46 %   MCV 93.6 80.0 - 100.0 fL   MCH 30.8 26.0 - 34.0 pg   MCHC 32.9 30.0 - 36.0 g/dL   RDW 69.6 78.9 -  15.5 %   Platelets 242 150 - 400 K/uL   nRBC 0.0 0.0 - 0.2 %   Neutrophils Relative % 82 %   Neutro Abs 10.9 (H) 1.7 - 7.7 K/uL   Lymphocytes Relative 13 %   Lymphs Abs 1.8 0.7 - 4.0 K/uL   Monocytes Relative 5 %   Monocytes Absolute 0.7 0 - 1 K/uL   Eosinophils Relative 0 %   Eosinophils Absolute 0.0 0 - 0 K/uL   Basophils Relative 0 %   Basophils Absolute 0.1 0 - 0 K/uL   Immature Granulocytes 0 %   Abs Immature Granulocytes 0.06 0.00 - 0.07 K/uL  Pregnancy, urine  Result Value Ref Range   Preg Test, Ur NEGATIVE NEGATIVE  Urinalysis, Routine w reflex microscopic  Result Value Ref Range   Color, Urine ORANGE (A) YELLOW   APPearance CLEAR CLEAR   Specific Gravity, Urine >1.030 (H) 1.005 - 1.030   pH 6.5 5.0 - 8.0   Glucose, UA NEGATIVE NEGATIVE mg/dL   Hgb urine dipstick NEGATIVE NEGATIVE   Bilirubin Urine SMALL (A) NEGATIVE   Ketones, ur NEGATIVE NEGATIVE mg/dL   Protein, ur NEGATIVE NEGATIVE mg/dL   Nitrite NEGATIVE NEGATIVE   Leukocytes,Ua NEGATIVE NEGATIVE  Comprehensive metabolic panel  Result Value Ref Range   Sodium 137 135 - 145 mmol/L   Potassium 4.0 3.5 - 5.1 mmol/L   Chloride 100 98 - 111 mmol/L   CO2 26 22 - 32 mmol/L   Glucose, Bld 160 (H) 70 - 99 mg/dL   BUN 16 6 - 20 mg/dL   Creatinine, Ser 1.61 0.44 - 1.00 mg/dL   Calcium 8.9 8.9 - 09.6 mg/dL   Total Protein 7.7 6.5 - 8.1 g/dL   Albumin 4.4 3.5 - 5.0 g/dL   AST 045 (H) 15 - 41 U/L   ALT 89 (H) 0 - 44 U/L   Alkaline Phosphatase 64 38 - 126 U/L   Total Bilirubin 1.7 (H)  0.3 - 1.2 mg/dL   GFR calc non Af Amer >60 >60 mL/min   GFR calc Af Amer >60 >60 mL/min   Anion gap 11 5 - 15  Lipase, blood  Result Value Ref Range   Lipase 37 11 - 51 U/L   CT ABDOMEN PELVIS W CONTRAST  Result Date: 09/02/2019 CLINICAL DATA:  Right upper quadrant pain radiating to back, nausea EXAM: CT ABDOMEN AND PELVIS WITH CONTRAST TECHNIQUE: Multidetector CT imaging of the abdomen and pelvis was performed using the standard protocol following bolus administration of intravenous contrast. CONTRAST:  OMNIPAQUE IOHEXOL 300 MG/ML  SOLN COMPARISON:  06/28/2017 FINDINGS: Lower chest: No acute pleural or parenchymal lung disease. Hepatobiliary: No focal liver abnormality is seen. No gallstones, gallbladder wall thickening, or biliary dilatation. Pancreas: Unremarkable. No pancreatic ductal dilatation or surrounding inflammatory changes. Spleen: Normal in size without focal abnormality. Adrenals/Urinary Tract: Nonobstructing calculi are seen within the lower pole left kidney, largest measuring 6 mm. Right kidney is unremarkable. Bladder is minimally distended with no gross abnormalities. The adrenals are normal. Stomach/Bowel: No bowel obstruction or ileus. There is diffuse colonic diverticulosis without evidence of diverticulitis. Normal appendix is seen in the right lower quadrant. Vascular/Lymphatic: No significant vascular findings are present. No enlarged abdominal or pelvic lymph nodes. Reproductive: Hypodensity within the region of the cervix measures approximately 3.3 x 2.2 x 1.6 cm, increased in prominence since prior study. This may reflect multiple nabothian cysts. Normal follicles are seen within the ovaries. Other: No free fluid or free  gas.  No abdominal wall hernia. Musculoskeletal: No acute or destructive bony lesions. Reconstructed images demonstrate no additional findings. IMPRESSION: 1. Nonobstructing left renal calculi. 2. Diffuse colonic diverticulosis without diverticulitis. 3.  Hypodensity within the region of the cervix, increased in prominence since prior study. This may reflect multiple nabothian cysts. Correlation with pelvic exam and Pap smear recommended. Electronically Signed   By: Sharlet SalinaMichael  Brown M.D.   On: 09/02/2019 01:19    None  Radiology CT ABDOMEN PELVIS W CONTRAST  Result Date: 09/02/2019 CLINICAL DATA:  Right upper quadrant pain radiating to back, nausea EXAM: CT ABDOMEN AND PELVIS WITH CONTRAST TECHNIQUE: Multidetector CT imaging of the abdomen and pelvis was performed using the standard protocol following bolus administration of intravenous contrast. CONTRAST:  100mL OMNIPAQUE IOHEXOL 300 MG/ML  SOLN COMPARISON:  06/28/2017 FINDINGS: Lower chest: No acute pleural or parenchymal lung disease. Hepatobiliary: No focal liver abnormality is seen. No gallstones, gallbladder wall thickening, or biliary dilatation. Pancreas: Unremarkable. No pancreatic ductal dilatation or surrounding inflammatory changes. Spleen: Normal in size without focal abnormality. Adrenals/Urinary Tract: Nonobstructing calculi are seen within the lower pole left kidney, largest measuring 6 mm. Right kidney is unremarkable. Bladder is minimally distended with no gross abnormalities. The adrenals are normal. Stomach/Bowel: No bowel obstruction or ileus. There is diffuse colonic diverticulosis without evidence of diverticulitis. Normal appendix is seen in the right lower quadrant. Vascular/Lymphatic: No significant vascular findings are present. No enlarged abdominal or pelvic lymph nodes. Reproductive: Hypodensity within the region of the cervix measures approximately 3.3 x 2.2 x 1.6 cm, increased in prominence since prior study. This may reflect multiple nabothian cysts. Normal follicles are seen within the ovaries. Other: No free fluid or free gas.  No abdominal wall hernia. Musculoskeletal: No acute or destructive bony lesions. Reconstructed images demonstrate no additional findings. IMPRESSION:  1. Nonobstructing left renal calculi. 2. Diffuse colonic diverticulosis without diverticulitis. 3. Hypodensity within the region of the cervix, increased in prominence since prior study. This may reflect multiple nabothian cysts. Correlation with pelvic exam and Pap smear recommended. Electronically Signed   By: Sharlet SalinaMichael  Brown M.D.   On: 09/02/2019 01:19    Procedures Procedures (including critical care time)  Medications Ordered in ED Medications  iohexol (OMNIPAQUE) 300 MG/ML solution 100 mL (100 mLs Intravenous Contrast Given 09/02/19 0059)  ketorolac (TORADOL) 30 MG/ML injection 30 mg (30 mg Intravenous Given 09/02/19 0242)  dicyclomine (BENTYL) capsule 10 mg (10 mg Oral Given 09/02/19 0244)  alum & mag hydroxide-simeth (MAALOX/MYLANTA) 200-200-20 MG/5ML suspension 30 mL (30 mLs Oral Given 09/02/19 0327)    And  lidocaine (XYLOCAINE) 2 % viscous mouth solution 15 mL (15 mLs Oral Given 09/02/19 0327)    ED Course  I have reviewed the triage vital signs and the nursing notes.  Pertinent labs & imaging results that were available during my care of the patient were reviewed by me and considered in my medical decision making (see chart for details).  Pain is markedly improved.  I believe this to be biliary colic. There were no stones seen on CT and no gall bladder thickening however LFT and history support this diagnosis and the fact it has happened in the past.  I have discussed the labs and imaging findings with the patient and need for bland diet and close outpatient follow up.  I have given strict return precautions for pain, fever, vomiting or any concerns.  Patient and husband verbalize understanding and agree to follow up.    Amanda A  Pugh was evaluated in Emergency Department on 09/02/2019 for the symptoms described in the history of present illness. She was evaluated in the context of the global COVID-19 pandemic, which necessitated consideration that the patient might be at risk for  infection with the SARS-CoV-2 virus that causes COVID-19. Institutional protocols and algorithms that pertain to the evaluation of patients at risk for COVID-19 are in a state of rapid change based on information released by regulatory bodies including the CDC and federal and state organizations. These policies and algorithms were followed during the patient's care in the ED.  Final Clinical Impression(s) / ED Diagnoses Final diagnoses:  Biliary colic   Return for intractable cough, coughing up blood,fevers >100.4 unrelieved by medication, shortness of breath, intractable vomiting, chest pain, shortness of breath, weakness,numbness, changes in speech, facial asymmetry,abdominal pain, passing out,Inability to tolerate liquids or food, cough, altered mental status or any concerns. No signs of systemic illness or infection. The patient is nontoxic-appearing on exam and vital signs are within normal limits.   I have reviewed the triage vital signs and the nursing notes. Pertinent labs &imaging results that were available during my care of the patient were reviewed by me and considered in my medical decision making (see chart for details).After history, exam, and medical workup I feel the patient has beenappropriately medically screened and is safe for discharge home. Pertinent diagnoses were discussed with the patient. Patient was given return precautions. Rx / DC Orders ED Discharge Orders         Ordered    omeprazole (PRILOSEC) 20 MG capsule  Daily     Discontinue  Reprint     09/02/19 0420    dicyclomine (BENTYL) 20 MG tablet  2 times daily     Discontinue  Reprint     09/02/19 0420           Terriona Horlacher, MD 09/02/19 0425

## 2019-09-11 ENCOUNTER — Other Ambulatory Visit: Payer: Self-pay | Admitting: General Surgery

## 2019-09-11 DIAGNOSIS — R7989 Other specified abnormal findings of blood chemistry: Secondary | ICD-10-CM

## 2019-09-11 DIAGNOSIS — K8050 Calculus of bile duct without cholangitis or cholecystitis without obstruction: Secondary | ICD-10-CM

## 2019-09-18 ENCOUNTER — Other Ambulatory Visit: Payer: Self-pay

## 2019-09-18 ENCOUNTER — Ambulatory Visit
Admission: RE | Admit: 2019-09-18 | Discharge: 2019-09-18 | Disposition: A | Discharge status: Home / Self Care | Payer: BC Managed Care – PPO | Source: Ambulatory Visit | Attending: General Surgery | Admitting: General Surgery

## 2019-09-18 DIAGNOSIS — R7989 Other specified abnormal findings of blood chemistry: Secondary | ICD-10-CM

## 2019-09-18 DIAGNOSIS — K8050 Calculus of bile duct without cholangitis or cholecystitis without obstruction: Secondary | ICD-10-CM

## 2019-10-08 ENCOUNTER — Ambulatory Visit: Payer: Self-pay | Admitting: General Surgery

## 2020-01-21 ENCOUNTER — Other Ambulatory Visit: Payer: Self-pay | Admitting: General Surgery

## 2020-01-21 ENCOUNTER — Other Ambulatory Visit: Payer: Self-pay | Admitting: Neurology

## 2020-01-21 DIAGNOSIS — K8050 Calculus of bile duct without cholangitis or cholecystitis without obstruction: Secondary | ICD-10-CM

## 2020-02-02 ENCOUNTER — Ambulatory Visit
Admission: RE | Admit: 2020-02-02 | Discharge: 2020-02-02 | Disposition: A | Discharge status: Home / Self Care | Payer: BC Managed Care – PPO | Source: Ambulatory Visit | Attending: General Surgery | Admitting: General Surgery

## 2020-02-02 DIAGNOSIS — K8050 Calculus of bile duct without cholangitis or cholecystitis without obstruction: Secondary | ICD-10-CM

## 2020-07-24 IMAGING — CT CT ABD-PELV W/ CM
2 of 5 series · 16 of 46 positions shown, 18 images · IV contrast (Omnipaque)
Comparison: 06/28/2017

CLINICAL DATA: Right upper quadrant pain radiating to back, nausea

EXAM:
CT ABDOMEN AND PELVIS WITH CONTRAST
TECHNIQUE: Multidetector CT imaging of the abdomen and pelvis was performed
using the standard protocol following bolus administration of
intravenous contrast.
CONTRAST:  100mL OMNIPAQUE IOHEXOL 300 MG/ML  SOLN

[Series 2: axial st · axial · 0.67mm/px · z∈[+210,+605]mm · 13 of 89 slices shown, 15 images]
[im 5/89  soft-tissue]
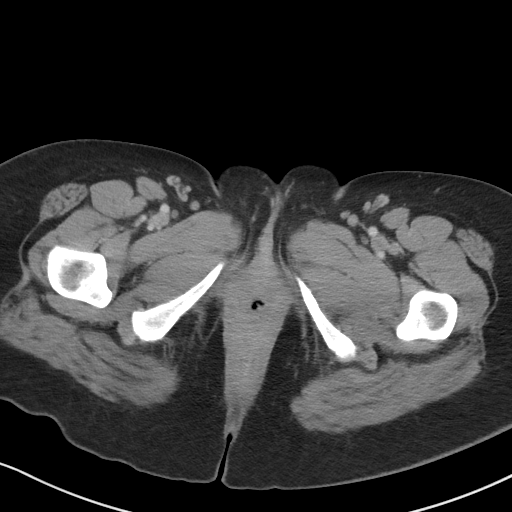
[im 5/89  bone]
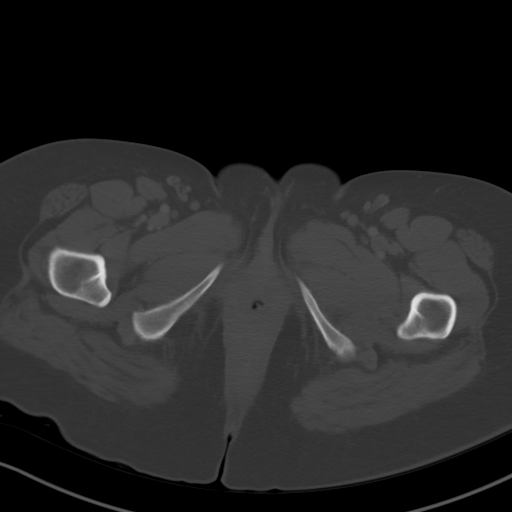
[im 14/89  soft-tissue]
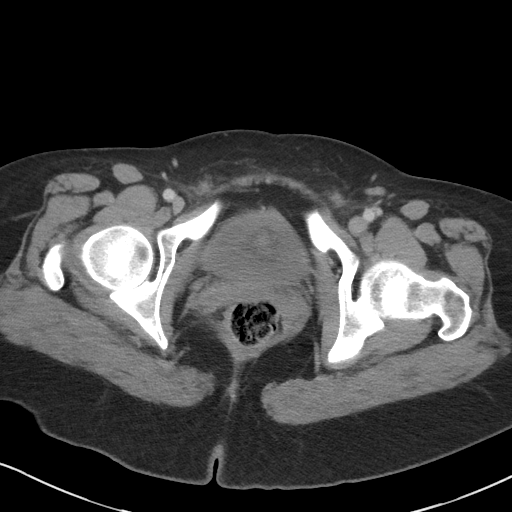
[im 19/89  soft-tissue]
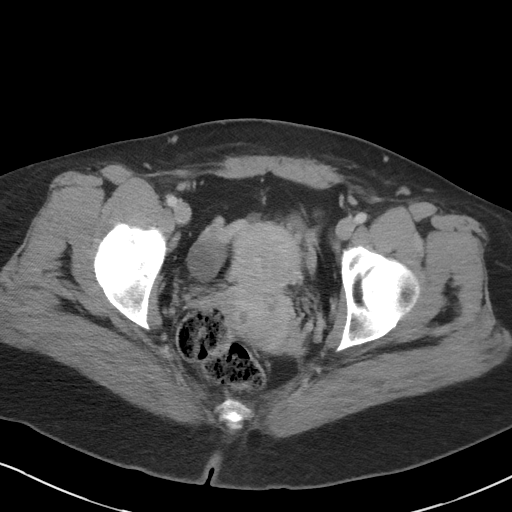
[im 24/89  soft-tissue]
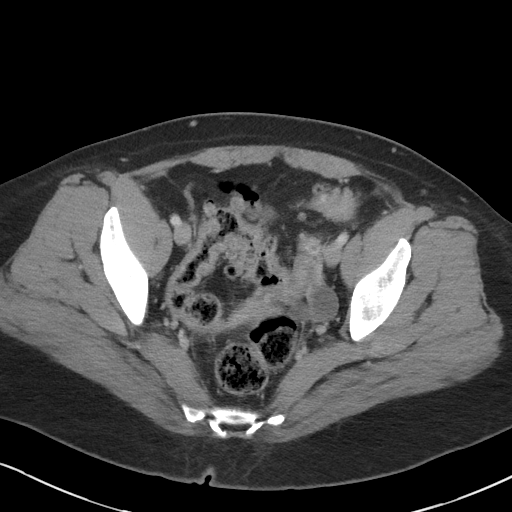
[im 33/89  soft-tissue]
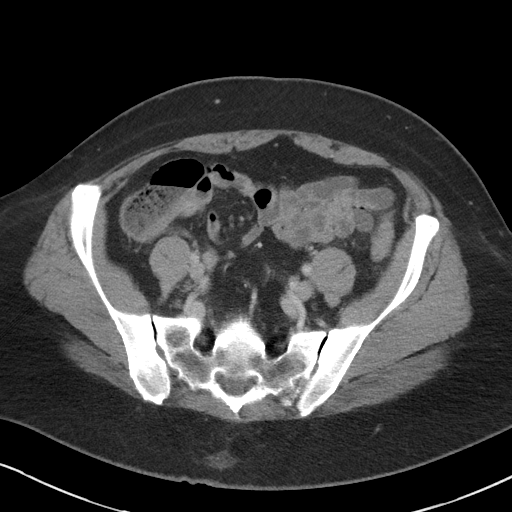
[im 38/89  soft-tissue]
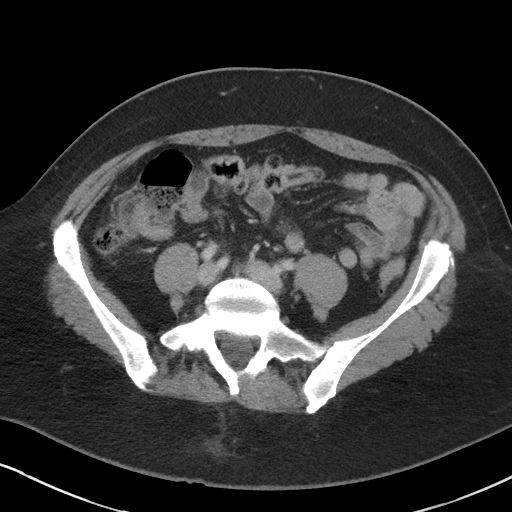
[im 47/89  soft-tissue]
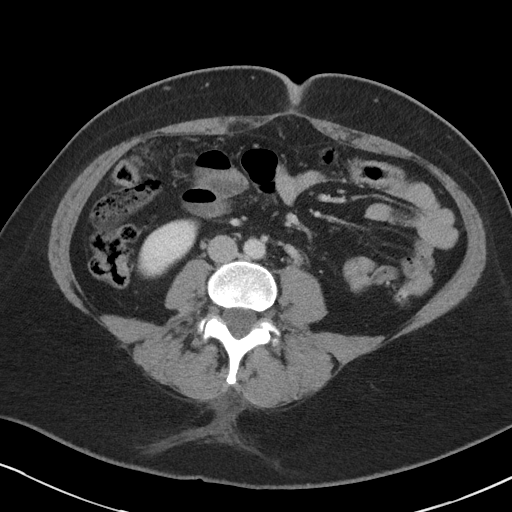
[im 51/89  soft-tissue]
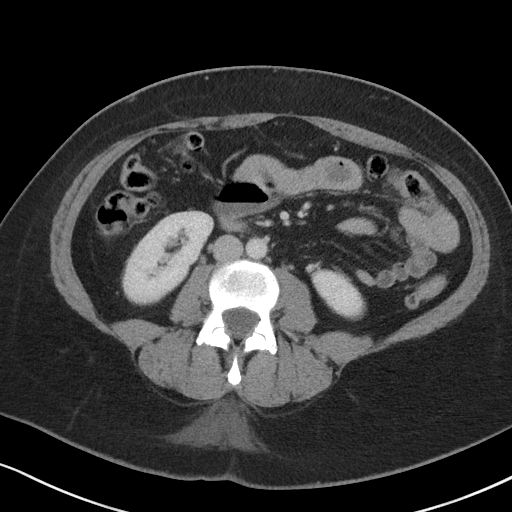
[im 56/89  soft-tissue]
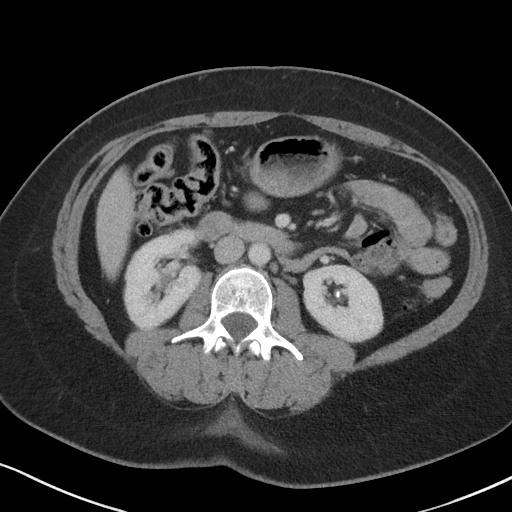
[im 56/89  bone]
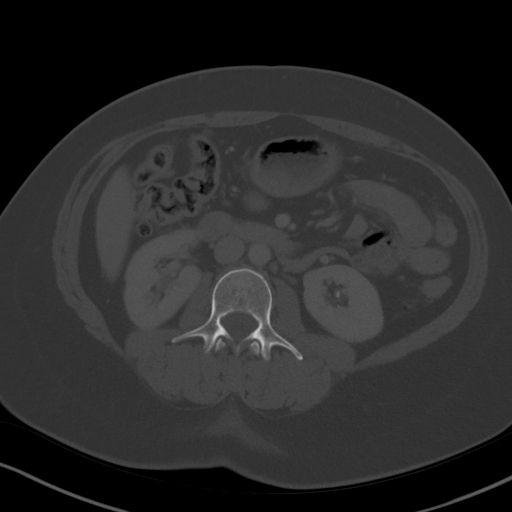
[im 65/89  soft-tissue]
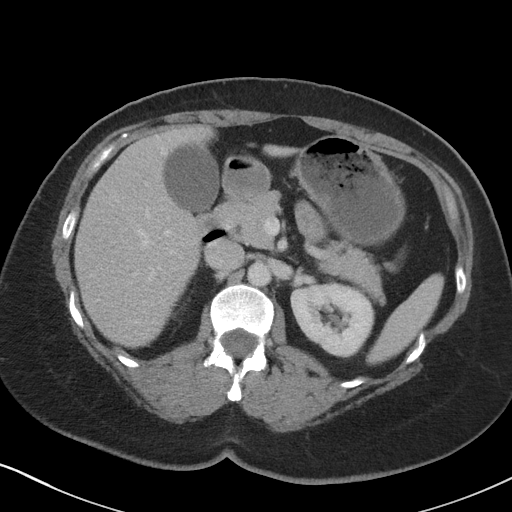
[im 70/89  soft-tissue]
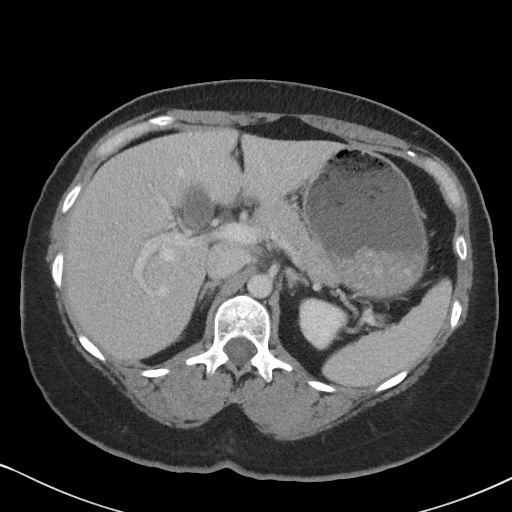
[im 75/89  soft-tissue]
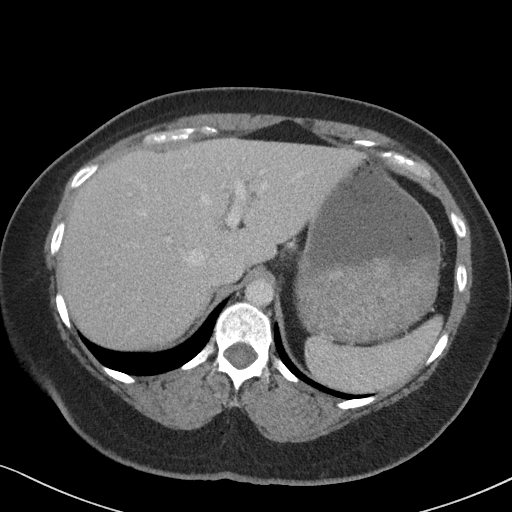
[im 84/89  soft-tissue]
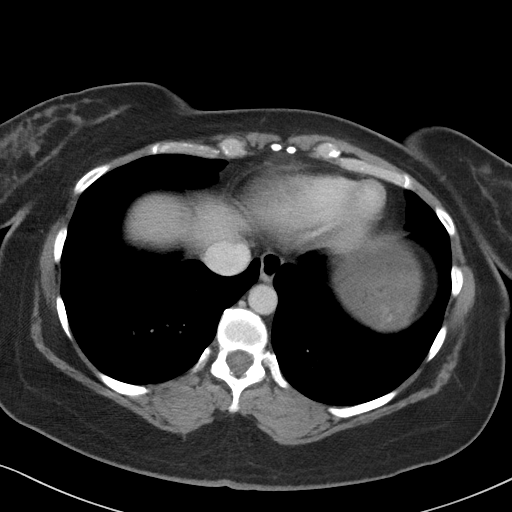

[Series 5: coronal st · coronal · 0.80mm/px · 3 of 89 slices shown]
[im 30/89  soft-tissue]
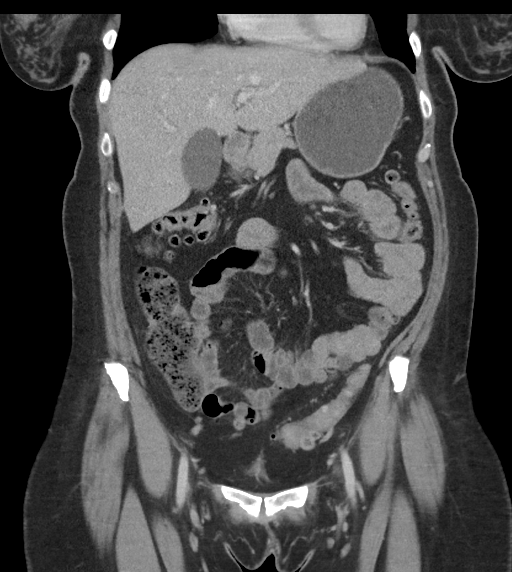
[im 40/89  soft-tissue]
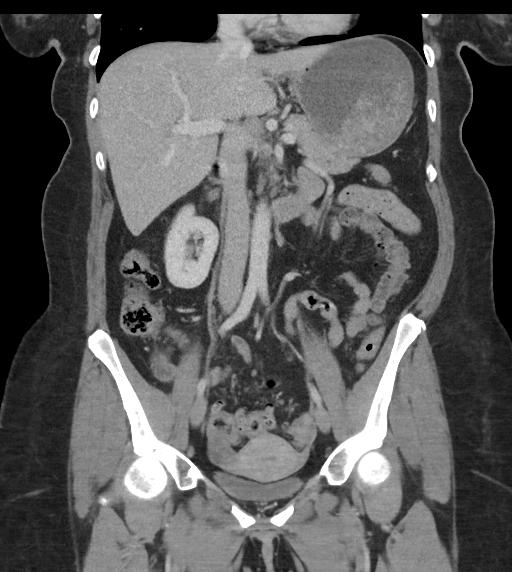
[im 49/89  soft-tissue]
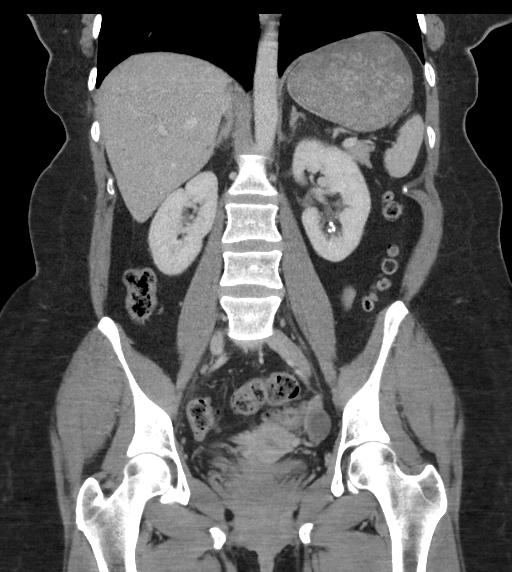

[16 of 46 positions shown; findings below may reference images not displayed]

FINDINGS: Lower chest: No acute pleural or parenchymal lung disease.

Hepatobiliary: No focal liver abnormality is seen. No gallstones,
gallbladder wall thickening, or biliary dilatation.

Pancreas: Unremarkable. No pancreatic ductal dilatation or
surrounding inflammatory changes.

Spleen: Normal in size without focal abnormality.

Adrenals/Urinary Tract: Nonobstructing calculi are seen within the
lower pole left kidney, largest measuring 6 mm. Right kidney is
unremarkable. Bladder is minimally distended with no gross
abnormalities. The adrenals are normal.

Stomach/Bowel: No bowel obstruction or ileus. There is diffuse
colonic diverticulosis without evidence of diverticulitis. Normal
appendix is seen in the right lower quadrant.

Vascular/Lymphatic: No significant vascular findings are present. No
enlarged abdominal or pelvic lymph nodes.

Reproductive: Hypodensity within the region of the cervix measures
approximately 3.3 x 2.2 x 1.6 cm, increased in prominence since
prior study. This may reflect multiple nabothian cysts. Normal
follicles are seen within the ovaries.

Other: No free fluid or free gas.  No abdominal wall hernia.

Musculoskeletal: No acute or destructive bony lesions. Reconstructed
images demonstrate no additional findings.
IMPRESSION: 1. Nonobstructing left renal calculi.
2. Diffuse colonic diverticulosis without diverticulitis.
3. Hypodensity within the region of the cervix, increased in
prominence since prior study. This may reflect multiple nabothian
cysts. Correlation with pelvic exam and Pap smear recommended.

## 2020-08-09 IMAGING — US US ABDOMEN LIMITED
1 series · 14 of 25 positions shown · non-contrast
Comparison: CT 09/02/2019, ultrasound 07/27/2015

CLINICAL DATA: Elevated liver function tests, biliary colic

EXAM:
ULTRASOUND ABDOMEN LIMITED RIGHT UPPER QUADRANT

[Series 1: us abdomen limited · 0.23mm/px · 14 of 39 slices shown]
[im 1/39]
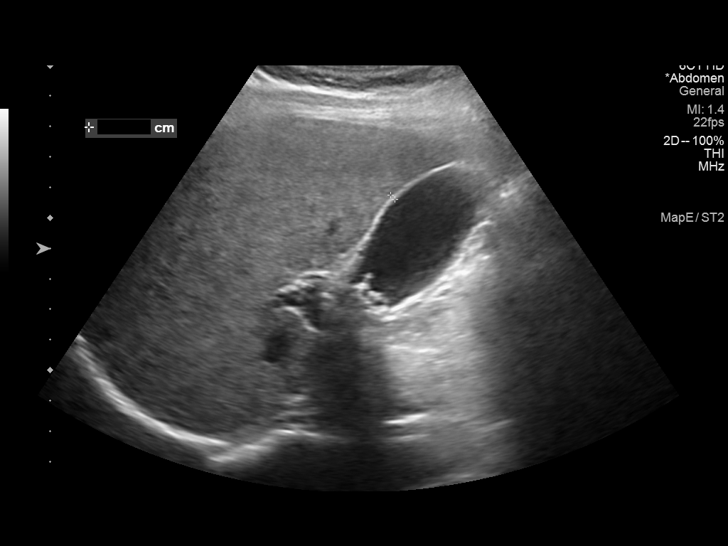
[im 4/39]
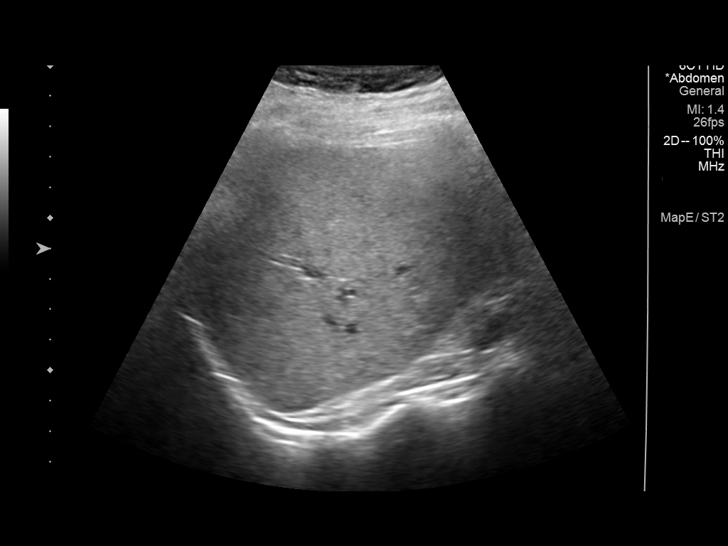
[im 7/39]
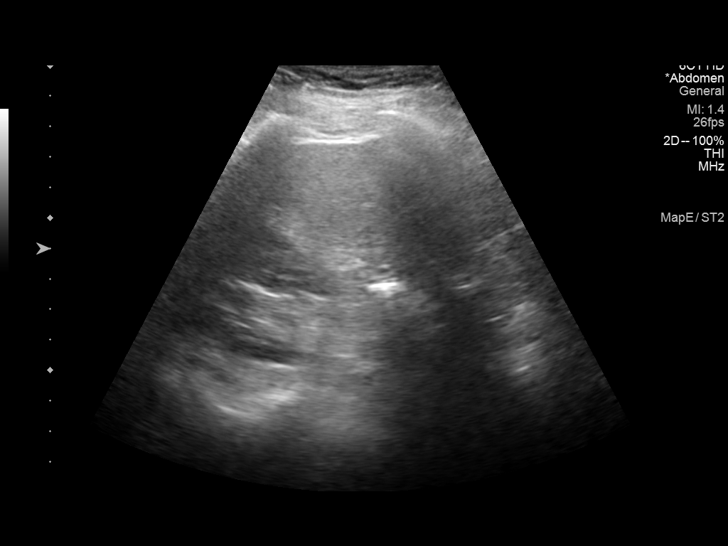
[im 10/39]
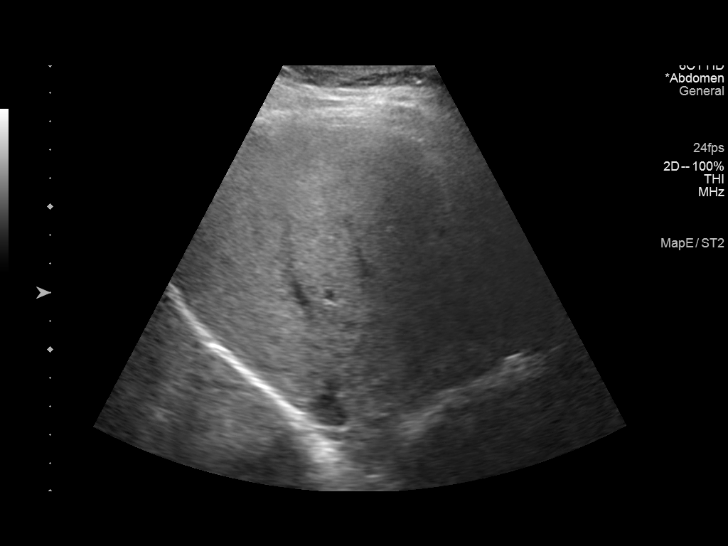
[im 13/39]
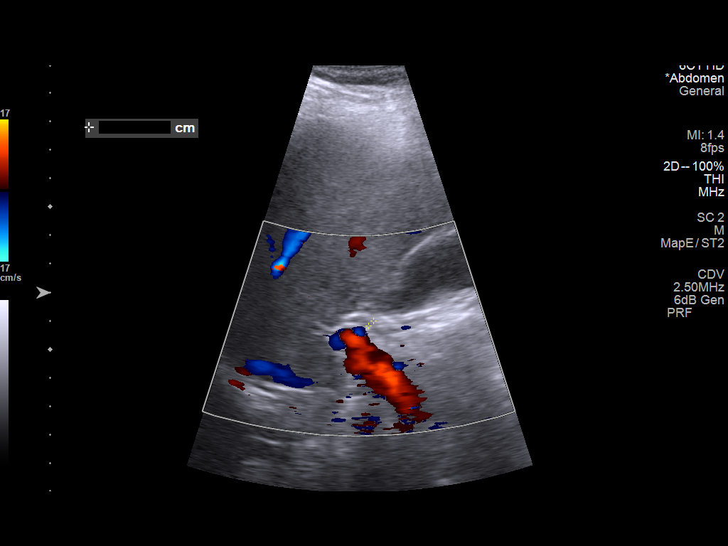
[im 15/39]
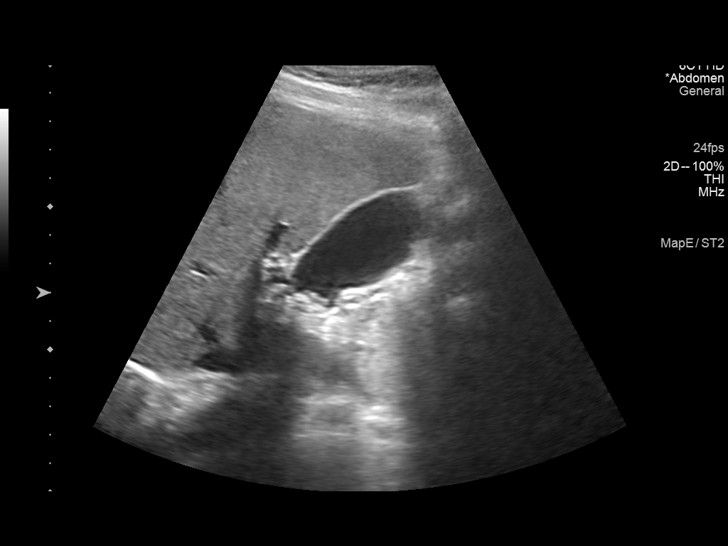
[im 18/39]
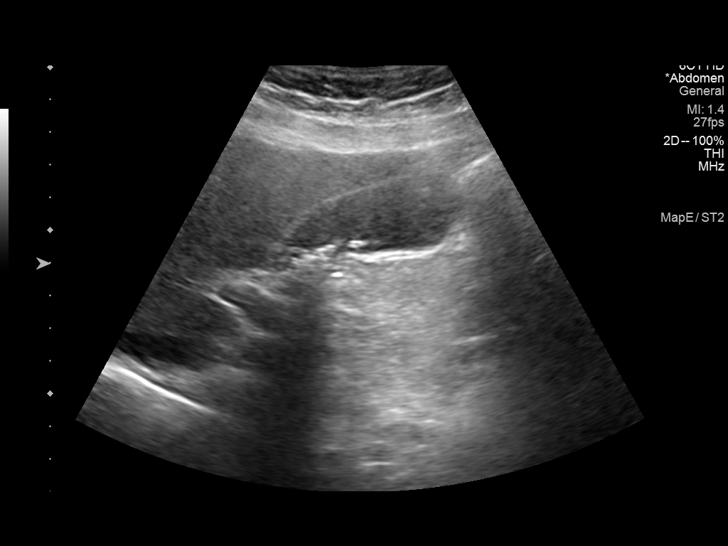
[im 21/39]
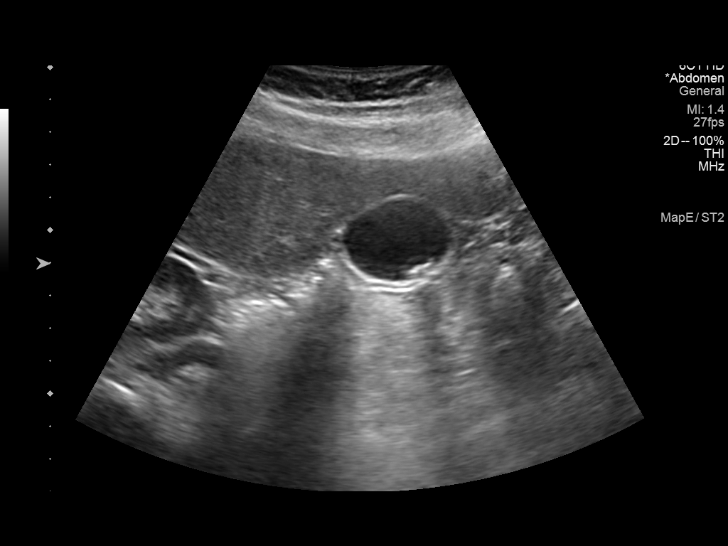
[im 24/39]
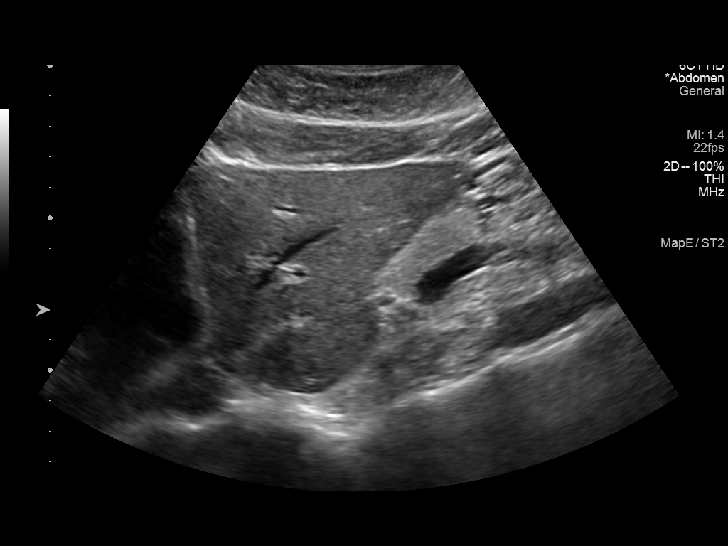
[im 26/39]
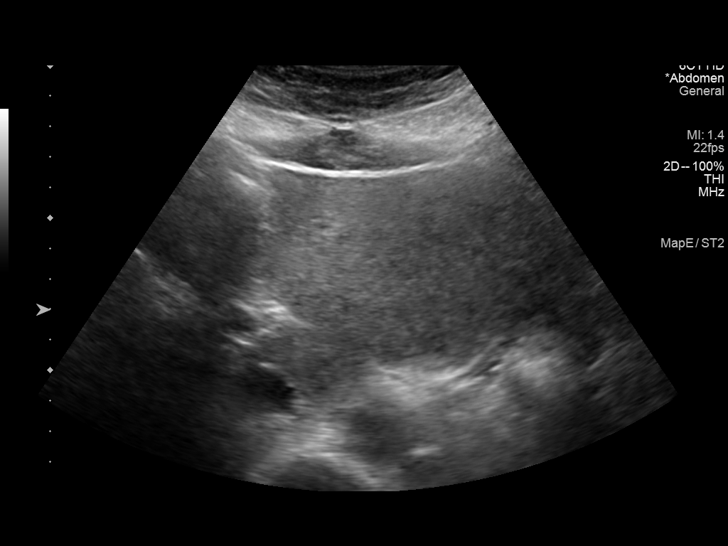
[im 29/39]
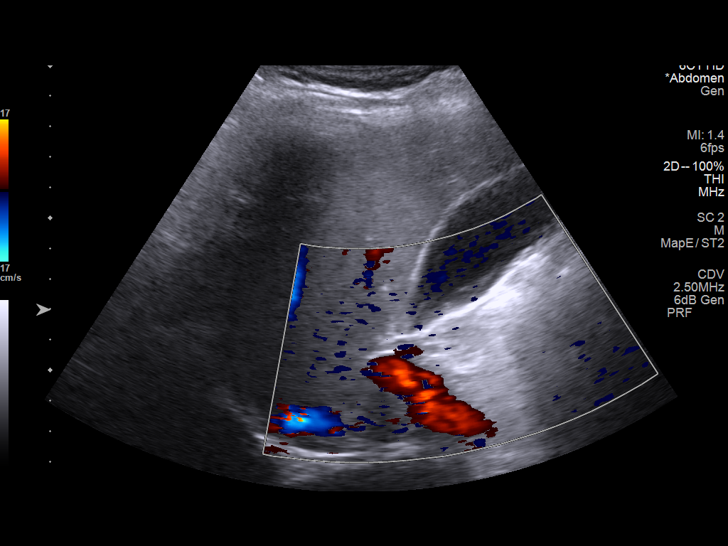
[im 32/39]
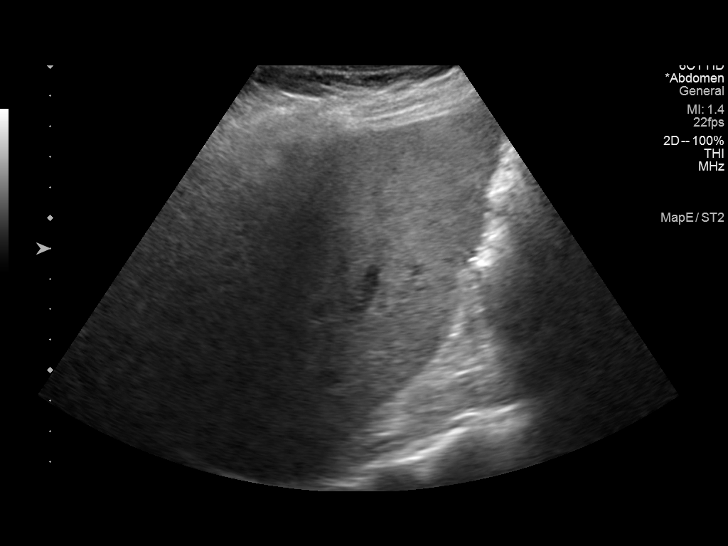
[im 35/39]
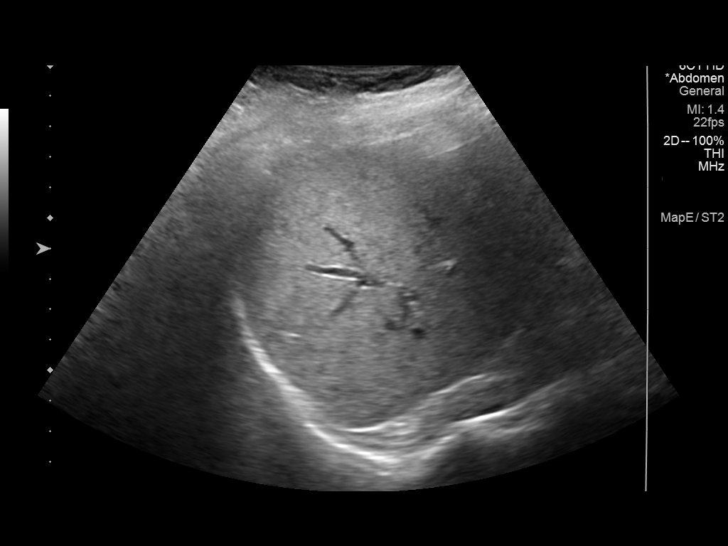
[im 39/39]
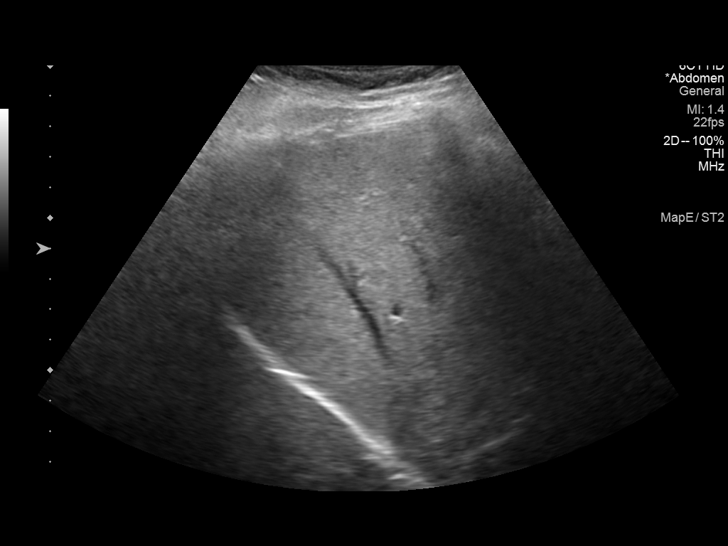

[14 of 25 positions shown; findings below may reference images not displayed]

FINDINGS: Gallbladder:

Numerous small shadowing gallstones are identified within the
gallbladder lumen all of which appear mobile on decubitus
positioning though the gallbladder neck is not optimally visualized.
The gallbladder is not distended, there is no gallbladder wall
thickening, and no pericholecystic fluid is identified. The
sonographic Murphy sign is reportedly negative.

Common bile duct:

Diameter: 2-3 mm in proximal diameter.

Liver:

The liver parenchyma is mildly, diffusely echogenic and the hepatic
echotexture is slightly coarsened in keeping with changes of mild to
moderate hepatic steatosis, improved since remote prior examination.
Portal vein is patent on color Doppler imaging with normal direction
of blood flow towards the liver.

Other: No ascites.
IMPRESSION: Cholelithiasis without sonographic evidence of acute cholecystitis.

Mild hepatic steatosis, improved since prior examination
